# Patient Record
Sex: Male | Born: 1985 | Race: Black or African American | Hispanic: No | Marital: Married | Smoking: Never smoker
Health system: Southern US, Community
[De-identification: ages and names within clinical notes are randomized; demographics above are authoritative.]

## PROBLEM LIST (undated history)

## (undated) DIAGNOSIS — G35 Multiple sclerosis: Secondary | ICD-10-CM

## (undated) DIAGNOSIS — I10 Essential (primary) hypertension: Secondary | ICD-10-CM

## (undated) DIAGNOSIS — G5600 Carpal tunnel syndrome, unspecified upper limb: Secondary | ICD-10-CM

## (undated) DIAGNOSIS — G259 Extrapyramidal and movement disorder, unspecified: Secondary | ICD-10-CM

## (undated) DIAGNOSIS — H539 Unspecified visual disturbance: Secondary | ICD-10-CM

## (undated) HISTORY — PX: OTHER SURGICAL HISTORY: SHX169

## (undated) HISTORY — DX: Unspecified visual disturbance: H53.9

## (undated) HISTORY — DX: Extrapyramidal and movement disorder, unspecified: G25.9

## (undated) HISTORY — DX: Essential (primary) hypertension: I10

## (undated) HISTORY — PX: MOUTH SURGERY: SHX715

## (undated) HISTORY — DX: Carpal tunnel syndrome, unspecified upper limb: G56.00

## (undated) HISTORY — DX: Multiple sclerosis: G35

---

## 2007-03-13 ENCOUNTER — Emergency Department (HOSPITAL_COMMUNITY): Admission: EM | Admit: 2007-03-13 | Discharge: 2007-03-13 | Payer: Self-pay | Admitting: Emergency Medicine

## 2007-03-16 ENCOUNTER — Emergency Department (HOSPITAL_COMMUNITY): Admission: EM | Admit: 2007-03-16 | Discharge: 2007-03-16 | Payer: Self-pay | Admitting: Emergency Medicine

## 2013-10-05 ENCOUNTER — Other Ambulatory Visit: Payer: Self-pay | Admitting: Occupational Medicine

## 2013-10-05 ENCOUNTER — Ambulatory Visit
Admission: RE | Admit: 2013-10-05 | Discharge: 2013-10-05 | Disposition: A | Discharge status: Home / Self Care | Payer: No Typology Code available for payment source | Source: Ambulatory Visit | Attending: Occupational Medicine | Admitting: Occupational Medicine

## 2013-10-05 DIAGNOSIS — Z021 Encounter for pre-employment examination: Secondary | ICD-10-CM

## 2013-11-02 ENCOUNTER — Ambulatory Visit (INDEPENDENT_AMBULATORY_CARE_PROVIDER_SITE_OTHER): Payer: BC Managed Care – PPO | Admitting: Neurology

## 2013-11-02 ENCOUNTER — Encounter: Payer: Self-pay | Admitting: Neurology

## 2013-11-02 VITALS — BP 153/101 | HR 104 | Temp 98.1°F | Ht 69.0 in | Wt 243.0 lb

## 2013-11-02 DIAGNOSIS — R270 Ataxia, unspecified: Secondary | ICD-10-CM | POA: Insufficient documentation

## 2013-11-02 DIAGNOSIS — R42 Dizziness and giddiness: Secondary | ICD-10-CM

## 2013-11-02 DIAGNOSIS — R202 Paresthesia of skin: Secondary | ICD-10-CM

## 2013-11-02 DIAGNOSIS — G5602 Carpal tunnel syndrome, left upper limb: Secondary | ICD-10-CM

## 2013-11-02 DIAGNOSIS — G5600 Carpal tunnel syndrome, unspecified upper limb: Secondary | ICD-10-CM | POA: Insufficient documentation

## 2013-11-02 NOTE — Patient Instructions (Addendum)
Overall you are doing fairly well but I do want to suggest a few things today:   Remember to drink plenty of fluid, eat healthy meals and do not skip any meals. Try to eat protein with a every meal and eat a healthy snack such as fruit or nuts in between meals. Try to keep a regular sleep-wake schedule and try to exercise daily, particularly in the form of walking, 20-30 minutes a day, if you can.   As far as diagnostic testing: MRi of the brain, EMG/NCS left arm, Labs today  I would like to see you back in 3 months, sooner if we need to. Please call us with any interim questions, concerns, problems, updates or refill requests.   Please also call us for any test results so we can go over those with you on the phone.  My clinical assistant and will answer any of your questions and relay your messages to me and also relay most of my messages to you.   Our phone number is [phone]. We also have an after hours call service for urgent matters and there is a physician on-call for urgent questions. For any emergencies you know to call 911 or go to the nearest emergency room

## 2013-11-02 NOTE — Progress Notes (Signed)
[facility]    Provider:  Dr Lucia Gaskins Referring Provider: Delfin Gant, MD Primary Care Physician:  No primary care provider on file.  CC:  Dizziness and numbness  HPI:  Lucas Berry is a 28 y.o. male here as a referral from Dr. Penni Bombard for Dizziness and numbness. Dizziness started 4-5 months ago and occurs several times a day and lasts for several minutes until he sits. He describes feeling off balance, not progressing, no inciting factors, no head trauma but is having a lot of stress currently. He can't walk straight, veers to the right or left. This happens if he has been sitting for too long and stands up to walk or if he has been walking for too long.He denies feeling lightheaded, feeling like he is going to pass out, no room spinning, no vision changes, no hearing changes, no weakness, no speech slurring or any other focal neurologic symptom. No CP, SOB,palpitations. No history of neurologic symptoms. No significanr PMHx.   He has been diagnosed with left hand carpal tunnel syndrome, numbness in all the fingers, denies weakness, no neck pain. Wrist splints not working. No radiation. Denies any pain.  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 Denies CP, SOB, palpitations, cough. Pertinent negatives per HPI. All others negative.   History   Social History  . Marital Status: Single    Spouse Name: N/A    Number of Children: 2  . Years of Education: Assoc   Occupational History  .  Other    [employer]   Social History Main Topics  . Smoking status: Never Smoker   . Smokeless tobacco: Never Used  . Alcohol Use: 0.0 oz/week    0  Not specified per week     Comment: weekly  . Drug Use: No  . Sexual Activity: Not on file   Other Topics Concern  . Not on file   Social History Narrative   Patient lives at home with his family.   Caffeine use: occasionally    Family History  Problem Relation Age of Onset  . High blood pressure Mother   . Cancer Maternal Grandmother     History reviewed. No pertinent past medical history.  Past Surgical History  Procedure Laterality Date  . Mouth surgery  10 years ago    No current outpatient prescriptions on file.   No current facility-administered medications for this visit.    Allergies as of 11/02/2013  . (No Known Allergies)    Vitals: BP 153/101 mmHg  Pulse 104  Temp(Src) 98.1 F (36.7 C) (Oral)  Ht 5\' 9"  (1.753 m)  Wt 243 lb (110.224 kg)  BMI 35.87 kg/m2 Last Weight:  Wt Readings from Last 1 Encounters:  11/02/13 243 lb (110.224 kg)   Last Height:   Ht Readings from Last 1 Encounters:  11/02/13 5\' 9"  (1.753 m)    Physical exam: Exam: Gen: NAD, conversant, well nourised, obese, well groomed                     CV: RRR, no MRG. No Carotid Bruits. No peripheral edema, warm, nontender Eyes: Conjunctivae clear without exudates or hemorrhage  Neuro: Detailed Neurologic Exam  Speech:    Speech is normal; fluent and spontaneous with normal comprehension.  Cognition:    The patient is oriented to person, place, and time;     recent and remote memory intact;     language fluent;     normal attention, concentration,     fund of knowledge Cranial Nerves:    The pupils are equal, round, and reactive to light. The fundi are normal and spontaneous venous pulsations are present. Visual fields are full to finger confrontation. Extraocular movements are intact. Trigeminal sensation is intact and the muscles of mastication are normal. The face is symmetric. The palate elevates in the midline. Voice is normal. Shoulder shrug is normal. The tongue has normal  motion without fasciculations.   Coordination:    Normal finger to nose and heel to shin. Normal rapid alternating movements.   Gait:    Heel-toe and tandem gait are normal.   Motor Observation:    No asymmetry, no atrophy, and no involuntary movements noted. Tone:    Normal muscle tone.    Posture:    Posture is normal. normal erect    Strength:    Strength is V/V in the upper and lower limbs.      Sensation: intact to LT     Reflex Exam:  DTR's:    Deep tendon reflexes in the upper and lower extremities are normal bilaterally.   Toes:    The toes are downgoing bilaterally.   Clonus:    3 beats clonus bilateral ankle jerks      Assessment/Plan:  28 year old male here for evaluation of ataxia and left hand paresthesias. Neuro exam unremarkable. Will order an MRI of the brain w/wo contrast to evaluate for his ataxia and feeling off balance with inability to walk straight (veers to the right and/or left), emg/ncs of the left arm to eval for left CTS and paresthesisas as well as labs today. His BP is high today and he is tachycardic but he says this is white-coat syndrome and his BP at home is always normal. Still recommend follow up with primary care for elevated BP and pulse.   Naomie DeanAntonia Ahern, MD  [facility] [address] [place], [place] [address]  Phone [phone] Fax [phone]

## 2013-11-03 LAB — CBC
HCT: 46.2 % (ref 37.5–51.0)
Hemoglobin: 15.1 g/dL (ref 12.6–17.7)
MCH: 26.1 pg — AB (ref 26.6–33.0)
MCHC: 32.7 g/dL (ref 31.5–35.7)
MCV: 80 fL (ref 79–97)
PLATELETS: 267 10*3/uL (ref 150–379)
RBC: 5.79 x10E6/uL (ref 4.14–5.80)
RDW: 13.9 % (ref 12.3–15.4)
WBC: 5.2 10*3/uL (ref 3.4–10.8)

## 2013-11-03 LAB — COMPREHENSIVE METABOLIC PANEL
A/G RATIO: 2.1 (ref 1.1–2.5)
ALBUMIN: 4.8 g/dL (ref 3.5–5.5)
ALK PHOS: 68 IU/L (ref 39–117)
ALT: 35 IU/L (ref 0–44)
AST: 30 IU/L (ref 0–40)
BILIRUBIN TOTAL: 0.4 mg/dL (ref 0.0–1.2)
BUN / CREAT RATIO: 7 — AB (ref 8–19)
BUN: 8 mg/dL (ref 6–20)
CO2: 25 mmol/L (ref 18–29)
CREATININE: 1.11 mg/dL (ref 0.76–1.27)
Calcium: 9.4 mg/dL (ref 8.7–10.2)
Chloride: 100 mmol/L (ref 97–108)
GFR, EST AFRICAN AMERICAN: 105 mL/min/{1.73_m2} (ref >59)
GFR, EST NON AFRICAN AMERICAN: 91 mL/min/{1.73_m2} (ref >59)
GLOBULIN, TOTAL: 2.3 g/dL (ref 1.5–4.5)
Glucose: 91 mg/dL (ref 65–99)
Potassium: 4.4 mmol/L (ref 3.5–5.2)
SODIUM: 140 mmol/L (ref 134–144)
Total Protein: 7.1 g/dL (ref 6.0–8.5)

## 2013-11-04 ENCOUNTER — Telehealth: Payer: Self-pay

## 2013-11-04 NOTE — Telephone Encounter (Signed)
Spoke to patient. Gave lab results. Patient requested to cancel his NCV/EMG. He states he is having a NCV/EMG done on 10/12/13 @ [facility].

## 2013-11-04 NOTE — Telephone Encounter (Signed)
-----   Message from Anson Fret, MD sent at 11/03/2013  3:50 PM EST ----- Please let patient know the labs look fine. Thank you

## 2013-11-13 ENCOUNTER — Encounter: Payer: BC Managed Care – PPO | Admitting: Neurology

## 2013-12-01 ENCOUNTER — Telehealth: Payer: Self-pay | Admitting: Neurology

## 2013-12-01 NOTE — Telephone Encounter (Signed)
Pt needs a letter for work would [place] to go back [place] in [place] please call pt [phone]

## 2013-12-01 NOTE — Telephone Encounter (Signed)
Can you call patient and let him know that I ordered an MRi of the brain. Doesn't look like he has had it done yet. I would like that completed before writing him a letter. Thank you

## 2013-12-01 NOTE — Telephone Encounter (Signed)
Returned call. Lmom that he has the wrong office. Dr. Karel Jarvis hasn't seen him he has been seen at [facility].

## 2013-12-01 NOTE — Telephone Encounter (Signed)
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  Please call and advise.

## 2013-12-02 NOTE — Telephone Encounter (Signed)
Spoke to patient and he is scheduled for MRI on Wed Dec 9th. Patient will like results soon as they get back.

## 2013-12-09 ENCOUNTER — Ambulatory Visit (INDEPENDENT_AMBULATORY_CARE_PROVIDER_SITE_OTHER): Payer: BC Managed Care – PPO

## 2013-12-09 DIAGNOSIS — R42 Dizziness and giddiness: Secondary | ICD-10-CM

## 2013-12-09 DIAGNOSIS — R270 Ataxia, unspecified: Secondary | ICD-10-CM

## 2013-12-10 ENCOUNTER — Telehealth: Payer: Self-pay | Admitting: Neurology

## 2013-12-10 MED ORDER — GADOPENTETATE DIMEGLUMINE 469.01 MG/ML IV SOLN: 20.0000 mL | Freq: Once | INTRAVENOUS | Status: AC | PRN

## 2013-12-10 NOTE — Telephone Encounter (Signed)
Spoke to patient he is aware lab results are not back yet

## 2013-12-10 NOTE — Telephone Encounter (Signed)
Pt is calling requesting MRI results.  Please call as soon as possible with results.  He needs the results for his job.

## 2013-12-14 ENCOUNTER — Telehealth: Payer: Self-pay | Admitting: Neurology

## 2013-12-14 NOTE — Telephone Encounter (Signed)
Pt is calling back requesting MRI results.  Please call and advise.

## 2013-12-14 NOTE — Telephone Encounter (Signed)
Called and left a message to speak with him about his MRI of the brain. Would prefer if he came in and I could discuss with him as results are suspicious for chronic demyelinating plaques.  Judeth Cornfield can you please call and see if he can come in for an appointment to review results? I will need 30 minutes, not a 15 minute follow up. Let me know if you schedule him so I can get the CD of his images from medical records. Thanks.

## 2013-12-14 NOTE — Telephone Encounter (Signed)
Patient stated Employer needs MRI results today.  Please call and advise.

## 2013-12-14 NOTE — Telephone Encounter (Signed)
Pt is calling back wanting his MRI results faxed to [facility] Attn: Noralee Chars [phone]. Also needs documentation from the doctor stating yes or no if he can start the [employer].  Please call and advise.

## 2013-12-15 ENCOUNTER — Ambulatory Visit (INDEPENDENT_AMBULATORY_CARE_PROVIDER_SITE_OTHER): Payer: BC Managed Care – PPO | Admitting: Neurology

## 2013-12-15 ENCOUNTER — Encounter: Payer: Self-pay | Admitting: Neurology

## 2013-12-15 VITALS — BP 133/87 | HR 100 | Ht 69.0 in | Wt 242.8 lb

## 2013-12-15 DIAGNOSIS — R42 Dizziness and giddiness: Secondary | ICD-10-CM

## 2013-12-15 DIAGNOSIS — R270 Ataxia, unspecified: Secondary | ICD-10-CM

## 2013-12-15 DIAGNOSIS — G35 Multiple sclerosis: Secondary | ICD-10-CM

## 2013-12-15 DIAGNOSIS — R9082 White matter disease, unspecified: Secondary | ICD-10-CM

## 2013-12-15 DIAGNOSIS — R930 Abnormal findings on diagnostic imaging of skull and head, not elsewhere classified: Secondary | ICD-10-CM

## 2013-12-15 NOTE — Telephone Encounter (Signed)
Patient was seen today by Dr. Ahern.  

## 2013-12-15 NOTE — Progress Notes (Signed)
[facility]    Provider:  Dr Lucia Gaskins Referring Provider: No ref. provider found Primary Care Physician:  No primary care provider on file.  CC:  Dizziness and Numbness  HPI:  Lucas Berry is a 28 y.o. male here as a follow up for Dizziness and numbness and to review his MRI results. The dizziness has improved. The numbness in the hand has improved. 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 The feelings of off balance are better. Denies weakness. His ataxia has improved. He took some steroids and feels better.   Reviewed MRI of the brain with patient, showed him images and explained results. Given symptoms, age and MRI findings, is suspicious for demyelinating disease.  Reviewed notes, labs and imaging from outside physicians, which showed: MRi of the brain showed Multiple round and ovoid, periventricular and subcortical, pontine, middle cerebelllar peduncle, medullary and upper C2 cervical spinal cord T2 hyperintense lesions. Findings are suspicious for chronic demyelinating plaques. Other autoimmune, inflammatory or post-infectious etiologies can have a similar appearance. No enhancing lesions.   Review of Systems: Patient complains of symptoms per HPI as well as the following symptoms: No CP, No SOB. Pertinent negatives per HPI. All others negative.  Initial encounter 11/2013:    Lucas Berry is a 28 y.o. male here as a referral from Dr. Penni Bombard for Dizziness and numbness. Dizziness started 4-5 months ago and occurs several times a day and lasts for several minutes until he sits. He describes feeling off balance, not progressing, no inciting factors, no head trauma but is having a lot of stress currently. He can't walk straight, veers to the right or left. This happens if he has been sitting for too long and stands up to walk or if he has been walking for too long.He denies feeling lightheaded, feeling like he is going to pass out, no room spinning, no vision  changes, no hearing changes, no weakness, no speech slurring or any other focal neurologic symptom. No CP, SOB,palpitations. No history of neurologic symptoms. No significanr PMHx.   He has been diagnosed with left hand carpal tunnel syndrome, numbness in all the fingers, denies weakness, no neck pain. Wrist splints not working. No radiation. Denies any pain.  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 Marital Status: Single    Spouse Name: N/A    Number of Children: 2  . Years of Education: Assoc   Occupational History  .  Other    [employer]   Social History Main Topics  . Smoking status: Never Smoker   . Smokeless tobacco: Never Used  . Alcohol Use: 0.0 oz/week    0 Not specified per week     Comment: weekly  . Drug Use: No  . Sexual Activity: Not on file   Other Topics Concern  . Not on file   Social History Narrative   Patient lives at home with his family.   Caffeine use: occasionally   Patient has a college education    Patient has 2 children    Patient is right handed     Family History  Problem Relation Age of Onset  . High blood pressure Mother   . Cancer Maternal Grandmother     Past Medical  History  Diagnosis Date  . Carpal tunnel syndrome     Past Surgical History  Procedure Laterality Date  . Mouth surgery  10 years ago    No current outpatient prescriptions on file.   No current facility-administered medications for this visit.    Allergies as of 12/15/2013  . (No Known Allergies)    Vitals: BP 133/87 mmHg  Pulse 100  Ht 5\' 9"  (1.753 m)  Wt 242 lb 12.8 oz (110.133 kg)  BMI 35.84 kg/m2 Last Weight:  Wt Readings from Last 1 Encounters:  12/15/13 242 lb 12.8 oz (110.133 kg)   Last  Height:   Ht Readings from Last 1 Encounters:  12/15/13 5\' 9"  (1.753 m)   Physical exam: Exam: Gen: NAD, conversant, well nourised, obese, well groomed                     CV: RRR, no MRG. No Carotid Bruits. No peripheral edema, warm, nontender Eyes: Conjunctivae clear without exudates or hemorrhage  Neuro: Detailed Neurologic Exam  Speech:    Speech is normal; fluent and spontaneous with normal comprehension.  Cognition:    The patient is oriented to person, place, and time;     recent and remote memory intact;     language fluent;     normal attention, concentration,     fund of knowledge Cranial Nerves:    The pupils are equal, round, and reactive to light. The fundi are normal and spontaneous venous pulsations are present. Visual fields are full to finger confrontation. Extraocular movements are intact. Trigeminal sensation is intact and the muscles of mastication are normal. The face is symmetric. The palate elevates in the midline. Voice is normal. Shoulder shrug is normal. The tongue has normal motion without fasciculations.   Coordination:    Normal finger to nose and heel to shin. Normal rapid alternating movements.   Gait:    Heel-toe and tandem gait are normal.   Motor Observation:    No asymmetry, no atrophy, and no involuntary movements noted. Tone:    Normal muscle tone.    Posture:    Posture is normal. normal erect    Strength:    Strength is V/V in the upper and lower limbs.      Sensation:  intact to LT     Reflex Exam:  DTR's:    Deep tendon reflexes in the upper and lower extremities are normal bilaterally.   Toes:    The toes are downgoing bilaterally.   Clonus:    3 beats bilat.       Assessment/Plan:  28 year old male here for follow up of dizziness and ataxia. Reviewed MRI of the brain with patient, showed him images and explained results. Given symptoms, age and MRI findings, is suspicious for demyelinating disease.   MRi of the  brain showed Multiple round and ovoid, periventricular and subcortical, pontine, middle cerebelllar peduncle, medullary and upper C2 cervical spinal cord T2 hyperintense lesions. Findings are suspicious for chronic demyelinating plaques. Other autoimmune, inflammatory or post-infectious etiologies can have a similar appearance. No enhancing lesions.    Naomie DeanAntonia Ahern, MD  [facility] [address] [place], [place] [address]  Phone [phone] Fax [phone]

## 2013-12-15 NOTE — Telephone Encounter (Signed)
Patient was seen today by Dr. Lucia Gaskins.

## 2013-12-16 ENCOUNTER — Telehealth: Payer: Self-pay | Admitting: Neurology

## 2013-12-16 NOTE — Telephone Encounter (Signed)
Don't see where patient is suppose to follow up this week. I see patient is suppose to follow up in 3 months. Please advise

## 2013-12-16 NOTE — Telephone Encounter (Signed)
Patient questioning next appointment with Dr. Lucia GaskinsAhern.  According to patient, he stated MD wanted to fu with another day this week.  Please call and advise.

## 2013-12-17 DIAGNOSIS — G35 Multiple sclerosis: Secondary | ICD-10-CM | POA: Insufficient documentation

## 2013-12-17 NOTE — Addendum Note (Signed)
Addended by: Naomie DeanAHERN, Hildreth Orsak B on: 12/17/2013 08:04 PM   Modules accepted: Orders

## 2013-12-17 NOTE — Addendum Note (Signed)
Addended by: Naomie Dean B on: 12/17/2013 08:15 PM   Modules accepted: Orders

## 2013-12-17 NOTE — Telephone Encounter (Signed)
Lucas Berry, would you call patient and let him know the following:  1. He can come to the office anytime and have his labs drawn. The orders are placed. Just tell him the lab hours so he doesn't come when they are at lunch or closed 2. I have placed a referral to [facility] for lumbar puncture and they should call him next week for the appointment 3. I have placed an order for visual evoked potentials, he can set up an appointment to have than done here 4. I am going to order imaging of his cervical cord and thoracic cord, both MRIs. We will get that approved and call him for appointment.   Thank you.

## 2013-12-21 NOTE — Telephone Encounter (Signed)
Patient calling again regarding next appointment.  I relayed information per Dr. Trevor Mace instructions.  I didn't see an order for visual evoked potentials test.  Please call and advise.

## 2013-12-22 ENCOUNTER — Ambulatory Visit (INDEPENDENT_AMBULATORY_CARE_PROVIDER_SITE_OTHER): Payer: BC Managed Care – PPO | Admitting: Diagnostic Neuroimaging

## 2013-12-22 ENCOUNTER — Other Ambulatory Visit (INDEPENDENT_AMBULATORY_CARE_PROVIDER_SITE_OTHER): Payer: Self-pay

## 2013-12-22 DIAGNOSIS — G35 Multiple sclerosis: Secondary | ICD-10-CM

## 2013-12-22 DIAGNOSIS — Z0289 Encounter for other administrative examinations: Secondary | ICD-10-CM

## 2013-12-22 NOTE — Progress Notes (Signed)
See report

## 2013-12-22 NOTE — Procedures (Addendum)
    [facility]  VEP (VISUAL EVOKED POTENTIAL) REPORT   STUDY DATE: 12/22/13  PATIENT NAME: Desiree LucyKevin Fatima DOB: 11/30/1985 MRN: [id number]  ORDERING CLINICIAN: Naomie DeanAntonia Ahern, MD   TECHNOLOGIST: Gearldine ShownLorraine Jones  TECHNIQUE: The visual evoked potential test was performed using 32 x 32 check sizes with full pattern reversal. CLINICAL INFORMATION: 28 year old male with abnormal MRI brain. Evaluate for demyelinating disease.  FINDINGS: The visual acuity was 20/30 OD and 20/30 OS.  There are well formed evoked potential wave forms bilaterally.   P100 latency with right eye stimulation: 128 ms.   P100 latency with left eye stimulation: 130 ms.  The amplitudes for the P100 waveforms were also within normal limits bilaterally.   IMPRESSION:  Abnormal visual evoked potential study demonstrating: - The bilateral P100 latencies with right and left eye stimulation are symmetrically prolonged. 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     INTERPRETING PHYSICIAN:  Suanne MarkerVIKRAM R. Silas Muff, MD Certified in Neurology, Neurophysiology and Neuroimaging  [facility] [address], [address] [place], [place] [id number] [phone]

## 2013-12-23 ENCOUNTER — Telehealth: Payer: Self-pay | Admitting: Neurology

## 2013-12-23 NOTE — Telephone Encounter (Signed)
Do not see results, please advise.

## 2013-12-23 NOTE — Telephone Encounter (Signed)
Would you call patient back and let him know it takes up to 5 days for all the lab results to be completed. I will call him when all the results are back.

## 2013-12-23 NOTE — Telephone Encounter (Signed)
Patient requesting blood work results.  Please call and advise. °

## 2013-12-23 NOTE — Telephone Encounter (Signed)
Spoke to patient and he is aware and verbalizes understanding.  

## 2013-12-24 LAB — PAN-ANCA
ANCA Proteinase 3: <3.5 U/mL (ref 0.0–3.5)
C-ANCA: <1:20 {titer}
Myeloperoxidase Ab: <9 U/mL (ref 0.0–9.0)

## 2013-12-24 LAB — COMPREHENSIVE METABOLIC PANEL
A/G RATIO: 2.3 (ref 1.1–2.5)
ALK PHOS: 61 IU/L (ref 39–117)
ALT: 33 IU/L (ref 0–44)
AST: 29 IU/L (ref 0–40)
Albumin: 4.5 g/dL (ref 3.5–5.5)
BUN / CREAT RATIO: 8 (ref 8–19)
BUN: 9 mg/dL (ref 6–20)
CO2: 23 mmol/L (ref 18–29)
CREATININE: 1.12 mg/dL (ref 0.76–1.27)
Calcium: 9.3 mg/dL (ref 8.7–10.2)
Chloride: 103 mmol/L (ref 97–108)
GFR calc Af Amer: 104 mL/min/{1.73_m2} (ref >59)
GFR, EST NON AFRICAN AMERICAN: 90 mL/min/{1.73_m2} (ref >59)
Globulin, Total: 2 g/dL (ref 1.5–4.5)
Glucose: 104 mg/dL — ABNORMAL HIGH (ref 65–99)
Potassium: 4.1 mmol/L (ref 3.5–5.2)
SODIUM: 139 mmol/L (ref 134–144)
Total Bilirubin: 0.6 mg/dL (ref 0.0–1.2)
Total Protein: 6.5 g/dL (ref 6.0–8.5)

## 2013-12-24 LAB — B12 AND FOLATE PANEL
Folate: 16.1 ng/mL (ref >3.0)
VITAMIN B 12: 469 pg/mL (ref 211–946)

## 2013-12-24 LAB — IFE AND PE, SERUM
ALBUMIN SERPL ELPH-MCNC: 4 g/dL (ref 3.2–5.6)
ALBUMIN/GLOB SERPL: 1.7 (ref 0.7–2.0)
Alpha 1: 0.2 g/dL (ref 0.1–0.4)
Alpha2 Glob SerPl Elph-Mcnc: 0.4 g/dL (ref 0.4–1.2)
B-Globulin SerPl Elph-Mcnc: 1 g/dL (ref 0.6–1.3)
GAMMA GLOB SERPL ELPH-MCNC: 0.9 g/dL (ref 0.5–1.6)
Globulin, Total: 2.5 g/dL (ref 2.0–4.5)
IGG (IMMUNOGLOBIN G), SERUM: 1052 mg/dL (ref 700–1600)
IgA/Immunoglobulin A, Serum: 165 mg/dL (ref 91–414)
IgM (Immunoglobulin M), Srm: 85 mg/dL (ref 40–230)

## 2013-12-24 LAB — CBC
HEMATOCRIT: 44.6 % (ref 37.5–51.0)
HEMOGLOBIN: 14.8 g/dL (ref 12.6–17.7)
MCH: 26 pg — AB (ref 26.6–33.0)
MCHC: 33.2 g/dL (ref 31.5–35.7)
MCV: 78 fL — ABNORMAL LOW (ref 79–97)
Platelets: 279 10*3/uL (ref 150–379)
RBC: 5.7 x10E6/uL (ref 4.14–5.80)
RDW: 13.8 % (ref 12.3–15.4)
WBC: 5 10*3/uL (ref 3.4–10.8)

## 2013-12-24 LAB — ANA W/REFLEX: Anti Nuclear Antibody(ANA): NEGATIVE

## 2013-12-24 LAB — HIV ANTIBODY (ROUTINE TESTING W REFLEX): HIV-1/HIV-2 Ab: NONREACTIVE

## 2013-12-24 LAB — ANTI-SCLERODERMA ANTIBODY: Scleroderma SCL-70: <0.2 AI (ref 0.0–0.9)

## 2013-12-24 LAB — SJOGREN'S SYNDROME ANTIBODS(SSA + SSB): ENA SSB (LA) Ab: <0.2 AI (ref 0.0–0.9)

## 2013-12-24 LAB — ANTI-DNA ANTIBODY, DOUBLE-STRANDED: dsDNA Ab: <1 IU/mL (ref 0–9)

## 2013-12-24 LAB — TSH: TSH: 2.3 u[IU]/mL (ref 0.450–4.500)

## 2013-12-24 LAB — RPR: SYPHILIS RPR SCR: NONREACTIVE

## 2013-12-24 LAB — SEDIMENTATION RATE: SED RATE: 6 mm/h (ref 0–15)

## 2013-12-24 LAB — NMO IGG AUTOANTIBODIES: NMO-IgG: <1.5 U/mL (ref 0.0–3.0)

## 2013-12-24 LAB — C-REACTIVE PROTEIN: CRP: 0.8 mg/L (ref 0.0–4.9)

## 2013-12-24 LAB — ANGIOTENSIN CONVERTING ENZYME: Angio Convert Enzyme: 22 U/L (ref 14–82)

## 2013-12-24 LAB — HTLV-I/II ANTIBODIES, QUAL.: HTLV I/II AB: NEGATIVE

## 2013-12-24 LAB — RHEUMATOID FACTOR: Rhuematoid fact SerPl-aCnc: 10.8 IU/mL (ref 0.0–13.9)

## 2013-12-28 ENCOUNTER — Telehealth: Payer: Self-pay | Admitting: Neurology

## 2013-12-28 NOTE — Telephone Encounter (Signed)
Patient called back and stated his work schedule has changed and wanted to know if he could have an appointment for later today.  Please call and advise.

## 2013-12-28 NOTE — Telephone Encounter (Signed)
I called the patient and he is not going to be able to keep appointment on Wednesday due to work conflict.  The next available is not until 01/08/14 but the pt is very anxious to get his results.  He said that he can be reached anytime and that he will have his phone on so that he can accept your call.  Pt is asking for Dr. Lucia GaskinsAhern to try and call him again to give results.

## 2013-12-28 NOTE — Telephone Encounter (Signed)
Patient called and I was able to make the patient an appointment for this Friday to go over his lab results.

## 2013-12-28 NOTE — Addendum Note (Signed)
Addended by: Arlis PortaHUGHES, Reizy Dunlow on: 12/28/2013 11:24 AM   Modules accepted: Medications

## 2013-12-28 NOTE — Telephone Encounter (Signed)
Tried calling patient. Home number went to voice mail and it was full so could not leave a message. His work number is a [employer] number.   Lucas NeedleMichael - can you please send a letter to patient informing him that we could not reach him by phone, we tried to call regarding his labwork and his visual evoked potential results. He should follow up with me in the office to review them all. Thank you.

## 2013-12-29 ENCOUNTER — Telehealth: Payer: Self-pay | Admitting: Neurology

## 2013-12-29 NOTE — Telephone Encounter (Signed)
Here's another 

## 2013-12-29 NOTE — Telephone Encounter (Signed)
Called patient again tonight. [phone]. Was able to leave a message tonight. Said that I tried calling multiple times the last few days  and his voicemail was full. I will try calling back again tomorrow.

## 2013-12-29 NOTE — Telephone Encounter (Signed)
Patient questioning if it's ok to start [employer] next 01/04/14 according to blood work results.  Please call and advise.

## 2013-12-30 ENCOUNTER — Other Ambulatory Visit: Payer: Self-pay | Admitting: Neurology

## 2013-12-30 ENCOUNTER — Ambulatory Visit: Payer: BC Managed Care – PPO | Admitting: Neurology

## 2013-12-30 DIAGNOSIS — G35 Multiple sclerosis: Secondary | ICD-10-CM

## 2013-12-30 NOTE — Telephone Encounter (Signed)
Reviewed labs with patient and abnormal results on visual evoked potentials. Really need his lumbar puncture results. He states he has not had a call to schedule lumbar puncture.  Lucas Berry - would you find out where his referral was sent and get the lumbar puncture scheduled please?  After his lumbar puncture results, I would like for him to see Dr. Epimenio Foot for a new MS diagnosis. So can you put him on Dr. Bonnita Hollow schedule as well please Lucas Berry? Touch base with the patient about all of this. Thank you

## 2013-12-30 NOTE — Telephone Encounter (Signed)
Called [facility], Dr Lucia Gaskins put in additional order, they will schedule.

## 2013-12-30 NOTE — Telephone Encounter (Signed)
Lucas Berry, will you see that they see Dr Epimenio Foot after the Lumbar Puncture results are available.

## 2013-12-30 NOTE — Telephone Encounter (Signed)
See additional phone note. 

## 2014-01-08 ENCOUNTER — Ambulatory Visit: Payer: BC Managed Care – PPO | Admitting: Neurology

## 2014-01-12 ENCOUNTER — Telehealth: Payer: Self-pay | Admitting: Neurology

## 2014-01-12 NOTE — Telephone Encounter (Signed)
Called patient at home (work/mobile is not current). Left message inquiring whether he had the lumbar puncture yet or an appointment. If he has any questions or has any problems he should let us know. We really need the lumbar puncture results, MRI of the brain and visual evoked potentials were both abnormal and suspicious for MS and earlier treatment improves outcomes.

## 2014-02-02 NOTE — Telephone Encounter (Signed)
Message was left for patient on Friday 01/29/14 that he could get in next week. Waiting on patient to call her back. Victorino Dike will call him back today.

## 2014-02-02 NOTE — Telephone Encounter (Signed)
Has patient had LP scheduled yet? If so he needs a follow-up with Dr. Epimenio Foot

## 2014-02-03 NOTE — Telephone Encounter (Signed)
Called patient to check  Had  To leave a voice mail. to see if he had his lumbar Puncture because patient needs to follow up with Dr.Sather after LP.

## 2014-02-03 NOTE — Telephone Encounter (Signed)
Patient called stating he has not had his LP scheduled yet. He been playing phone tag with them and will be calling them again today to get scheduled. Patient states that he will call the office as soon as he gets scheduled with them so that he can make an appt here for [place].

## 2014-03-05 ENCOUNTER — Telehealth: Payer: Self-pay | Admitting: *Deleted

## 2014-03-05 ENCOUNTER — Ambulatory Visit
Admission: RE | Admit: 2014-03-05 | Discharge: 2014-03-05 | Disposition: A | Discharge status: Home / Self Care | Payer: BLUE CROSS/BLUE SHIELD | Source: Ambulatory Visit | Attending: Neurology | Admitting: Neurology

## 2014-03-05 ENCOUNTER — Other Ambulatory Visit: Payer: Self-pay | Admitting: Neurology

## 2014-03-05 DIAGNOSIS — G35 Multiple sclerosis: Secondary | ICD-10-CM

## 2014-03-05 LAB — CSF CELL COUNT WITH DIFFERENTIAL
RBC Count, CSF: 0 cu mm
Tube #: 3
WBC, CSF: 4 cu mm (ref 0–5)

## 2014-03-05 LAB — GLUCOSE, CSF: Glucose, CSF: 63 mg/dL (ref 43–76)

## 2014-03-05 LAB — PROTEIN, CSF: TOTAL PROTEIN, CSF: 70 mg/dL — AB (ref 15–45)

## 2014-03-05 NOTE — Telephone Encounter (Signed)
Talked with Duwayne Heck from [facility] and she needs to speak with Dr. Lucia Gaskins to clarify orders for LP. Dr. Lucia Gaskins calling Danielle back.

## 2014-03-05 NOTE — Progress Notes (Signed)
Discharge instructions explained to pt. Blood drawn to go with spinal fluid and other serum for other labs as well.  Blood drawn from left [facility], site is unremarkable and pt tolerated procedure well.  JKL RN

## 2014-03-05 NOTE — Discharge Instructions (Signed)

## 2014-03-07 LAB — JC POLYOMA VIRUS DNA, QUAL R-T PCR: JC POLYOMA VIRUS DNA, QL: NOT DETECTED

## 2014-03-08 LAB — CSF CULTURE W GRAM STAIN: Organism ID, Bacteria: NO GROWTH

## 2014-03-08 LAB — CSF CULTURE: Gram Stain: NONE SEEN

## 2014-03-09 ENCOUNTER — Telehealth: Payer: Self-pay | Admitting: *Deleted

## 2014-03-09 ENCOUNTER — Telehealth: Payer: Self-pay

## 2014-03-09 NOTE — Telephone Encounter (Signed)
Mr. Lucas Berry called to report having headaches since his lumbar puncture on Friday, March 05, 2014.  I briefly explained an epidural blood patch procedure.  I encouraged him to resume the strict bedrest restrictions we had him on after the LP and to consume extra fluids, especially caffeinated ones and to contact Dr. Lucia Gaskins with his symptoms to see if she wants to have patient have the blood patch.  Donell Sievert, RN

## 2014-03-09 NOTE — Telephone Encounter (Signed)
Patient is still having headaches form LP which was done on Friday. Patient been doing bedrest for a couple of days now. He has had this headache since Friday. Please call the patient at [phone]

## 2014-03-09 NOTE — Telephone Encounter (Signed)
Spoke to him, his headache got better then he started having new nausea. sounds like maybe he has a GI bug, not related to the LP. He will stay at home and rest, drink fluids and keep [place] updated. If his headache persists he will def call back.

## 2014-03-10 ENCOUNTER — Telehealth: Payer: Self-pay | Admitting: *Deleted

## 2014-03-10 ENCOUNTER — Other Ambulatory Visit: Payer: Self-pay | Admitting: Neurology

## 2014-03-10 DIAGNOSIS — R51 Headache with orthostatic component, not elsewhere classified: Secondary | ICD-10-CM

## 2014-03-10 NOTE — Telephone Encounter (Signed)
Patient called and stated he's still experiencing headaches this am.  Please call and advise.

## 2014-03-10 NOTE — Telephone Encounter (Signed)
Talked with the pt and let him know Dr. Lucia Gaskins would like him to have a blood patch done at [facility]. I talked with Dr. Epimenio Foot and he said he will put the order in for Dr. Lucia Gaskins and I will call over the [place] [facility] to get him scheduled. I told patient I would give him a call back to let him know. Pt verbalized understanding.

## 2014-03-10 NOTE — Telephone Encounter (Signed)
Can you call this patient? See if the headache sounds like post-LP (positional). If so we will send him for blood patch. If not, lets see if we can squeeze him in tomorrow. thanks

## 2014-03-10 NOTE — Telephone Encounter (Signed)
Pt stated he had a LP on 03/05/14 and on Sunday night (03/07/14) and he was having a throbbing headache. He stated "I was on bed rest for 24 hr beforehand and I noticed when I was out and about Sunday that I had a headache". Patient states headache is better when laying down and it gets worse when standing. I talked with Deb from [facility] and she is going to call the patient to let him know he can come in at 930 tomorrow morning to have the blood patch done after the order is put in.

## 2014-03-11 ENCOUNTER — Telehealth: Payer: Self-pay | Admitting: Neurology

## 2014-03-11 ENCOUNTER — Ambulatory Visit
Admission: RE | Admit: 2014-03-11 | Discharge: 2014-03-11 | Disposition: A | Discharge status: Home / Self Care | Payer: BLUE CROSS/BLUE SHIELD | Source: Ambulatory Visit | Attending: Neurology | Admitting: Neurology

## 2014-03-11 DIAGNOSIS — R51 Headache with orthostatic component, not elsewhere classified: Secondary | ICD-10-CM

## 2014-03-11 LAB — OLIGOCLONAL BANDS, CSF + SERM

## 2014-03-11 LAB — CNS IGG SYNTHESIS RATE, CSF+BLOOD
Albumin, CSF: 37.1 mg/dL (ref 8.0–42.0)
Albumin, Serum(Neph): 4.3 g/dL (ref 3.7–5.1)
IgG Index, CSF: 0.78 — ABNORMAL HIGH (ref <0.66)
IgG, CSF: 7.2 mg/dL (ref 0.8–7.7)
IgG, Serum: 1070 mg/dL (ref 694–1618)
MS CNS IgG Synthesis Rate: 11.7 mg/24 h — ABNORMAL HIGH (ref <=3.3)

## 2014-03-11 LAB — MYELIN BASIC PROTEIN, CSF: Myelin Basic Protein: <2 mcg/L (ref 0.0–4.0)

## 2014-03-11 MED ORDER — IOHEXOL 180 MG/ML  SOLN
1.0000 mL | Freq: Once | INTRAMUSCULAR | Status: AC | PRN
  Administered 2014-03-11: 1 mL via EPIDURAL

## 2014-03-11 NOTE — Progress Notes (Signed)
Blood drawn from right [facility] for blood patch, 20 cc's obtained. Site is unremarkable and pt tolerated procedure well. Discharge instructions explained to pt.

## 2014-03-11 NOTE — Telephone Encounter (Signed)
Called to see how patient is feeling, if the blood patch helped with his post-LP headache. Also asked him to call back for lab test results and f/u with Dr. Epimenio Foot in the office. thanks

## 2014-03-11 NOTE — Telephone Encounter (Signed)
Patient is returning Dr Trevor Mace call of today 3/10.  He states he was asleep.  Please call back.  Thanks!

## 2014-03-11 NOTE — Discharge Instructions (Signed)

## 2014-03-12 ENCOUNTER — Telehealth: Payer: Self-pay | Admitting: Neurology

## 2014-03-12 ENCOUNTER — Telehealth: Payer: Self-pay | Admitting: *Deleted

## 2014-03-12 LAB — B. BURGDORFI ANTIBODIES, CSF: Lyme Ab: NEGATIVE

## 2014-03-12 NOTE — Telephone Encounter (Signed)
Called patient. Answering machine answered. Left message again. Advised him to call and make a new patient appointment with Dr. Epimenio Foot. Could not leave information regarding test results or interpretation given no identifying information on his voice mail. Thank you

## 2014-03-12 NOTE — Telephone Encounter (Signed)
Patient returning call. Patient is wanting a letter for work states he missed a couple of days this week. Patient is already scheduled with Dr. Epimenio Foot next week. Advised patient to try and answer the phone.

## 2014-03-12 NOTE — Telephone Encounter (Signed)
I can write the letter over the weekend. Lucas Berry, can you find out which days he missed please? Thanks.

## 2014-03-12 NOTE — Telephone Encounter (Signed)
Talked with patient and told him Dr. Lucia Gaskins can write a letter for Monday-Friday and Tuesday for his follow up appointment with Dr. Epimenio Foot. Pt verbalized understanding.  I told him he can have his GF pick it up (she is on his DPR) or get it on Tuesday at his appointment.

## 2014-03-15 ENCOUNTER — Encounter: Payer: Self-pay | Admitting: Neurology

## 2014-03-15 ENCOUNTER — Telehealth: Payer: Self-pay | Admitting: Neurology

## 2014-03-15 NOTE — Telephone Encounter (Signed)
Spoke to patient. Discussed LP results. He has an appointment tomorrow.

## 2014-03-15 NOTE — Telephone Encounter (Signed)
Patient calling for Lumbar Puncture results.  Please call and advise.

## 2014-03-16 ENCOUNTER — Encounter: Payer: Self-pay | Admitting: Neurology

## 2014-03-16 ENCOUNTER — Ambulatory Visit (INDEPENDENT_AMBULATORY_CARE_PROVIDER_SITE_OTHER): Payer: BLUE CROSS/BLUE SHIELD | Admitting: Neurology

## 2014-03-16 VITALS — BP 150/74 | HR 86 | Resp 16 | Ht 69.0 in | Wt 230.0 lb

## 2014-03-16 DIAGNOSIS — R350 Frequency of micturition: Secondary | ICD-10-CM | POA: Diagnosis not present

## 2014-03-16 DIAGNOSIS — G35 Multiple sclerosis: Secondary | ICD-10-CM

## 2014-03-16 DIAGNOSIS — R270 Ataxia, unspecified: Secondary | ICD-10-CM

## 2014-03-16 DIAGNOSIS — R200 Anesthesia of skin: Secondary | ICD-10-CM | POA: Diagnosis not present

## 2014-03-16 NOTE — Progress Notes (Signed)
[facility]  PATIENT: Lucas Berry DOB: 12-03-1985  REFERRING DOCTOR OR PCP:  Deatra James SOURCE: Patient  _________________________________   HISTORICAL  CHIEF COMPLAINT:  Chief Complaint  Patient presents with  . Multiple Sclerosis    Dx. with MS yesterday--presenting sx. gait/balance difficulty.  Dx. confirmed with mri and lp.  Lucas Berry needs to discuss MS therapy/fim  . Headaches    Lucas Berry c/o constant h/a since lumbar puncture 2 weeks ago.  Lucas Berry had a blood patch at [facility] last week, sts. still having h/a./fim    HISTORY OF PRESENT ILLNESS:  Lucas Berry is a 29 yo man who was just diagnosed with multiple sclerosis.   Lucas Berry first saw Dr. Lucia Gaskins in early December 2015 with a several month history of dizziness, ataxic gait and hand numbness.    MRI in early December 2015 that I personally reviewed showed multiple white matter foci including several in the posterior fossa (left middle cerebellar peduncle, pons, medulla) and periventricular foci in both hemispheres. Also had a spine focus at C2.  There were no acute lesions (no enhancement, no DWI brightness).   In late December blood work showed normal or negative vasculitis labs, ACE, Lyme, HIV, RPR, pan ANCA, and anti-NMO antibody.   Visual evoked potentials were abnormal showing prolongation of the P100 to about 130 ms on either side.  Since December, Lucas Berry has noticed some continuing issues with balance and mild weakness but is doing bette than Lucas Berry was.    It was Lucas Berry had a lumbar puncture on 03/05/2014. Abnormal showing > 5 oligoclonal bands and elevated IgG index of 0.78.     Lucas Berry needed a blood patch after the LP.  HA's are present still but better when Lucas Berry is upright.     Currently, gait is doing ok but not quite baseline.    Lucas Berry is able to run and climb stairs/ladders.   Lucas Berry is not stumbling.   Lucas Berry denies leg numbness but has had left hand numbness over a year.   Lucas Berry has some urinary frequency  But not bad enough to  consider treatment.    Lucas Berry denies any vision issues the last few months but notes Lucas Berry had trouble getting contacts with good correction last year.      Lucas Berry sees [facility]   Lucas Berry notes some fatigue with less [physical endurance but still does well.    Lucas Berry denies any problems with cognition.    Lucas Berry denies any issue with depression or anxiety.    Lucas Berry  Usually sleeps well but has done worse since HA started after LP.   REVIEW OF SYSTEMS: Constitutional: No fevers, chills, sweats, or change in appetite Eyes: No visual changes, double vision, eye pain Ear, nose and throat: No hearing loss, ear pain, nasal congestion, sore throat Cardiovascular: No chest pain, palpitations Respiratory: No shortness of breath at rest or with exertion.   No wheezes GastrointestinaI: No nausea, vomiting, diarrhea, abdominal pain, fecal incontinence Genitourinary: Mild frequency.  No incontinence.. Musculoskeletal: No neck pain, back pain Integumentary: No rash, pruritus, skin lesions Neurological: as above Psychiatric: No depression at this time.  No anxiety Endocrine: No palpitations, diaphoresis, change in appetite, change in weigh or increased thirst Hematologic/Lymphatic: No anemia, purpura, petechiae. Allergic/Immunologic: No itchy/runny eyes, nasal congestion, recent allergic reactions, rashes  ALLERGIES: No Known Allergies  HOME MEDICATIONS:  Current outpatient prescriptions:  .  amLODipine (NORVASC) 5 MG tablet, Take 5 mg by mouth daily., Disp: , Rfl: 0  PAST MEDICAL  HISTORY: Past Medical History  Diagnosis Date  . Carpal tunnel syndrome   . Multiple sclerosis   . Movement disorder   . Hypertension   . Vision abnormalities     PAST SURGICAL HISTORY: Past Surgical History  Procedure Laterality Date  . Mouth surgery  10 years ago    FAMILY HISTORY: Family History  Problem Relation Age of Onset  . High blood pressure Mother   . Cancer Maternal Grandmother   . High blood pressure Father      SOCIAL HISTORY:  History   Social History  . Marital Status: Single    Spouse Name: N/A  . Number of Children: 2  . Years of Education: Assoc   Occupational History  .  Other    [employer]   Social History Main Topics  . Smoking status: Never Smoker   . Smokeless tobacco: Never Used  . Alcohol Use: 0.0 oz/week    0 Standard drinks or equivalent per week     Comment: weekly  . Drug Use: No  . Sexual Activity: Not on file   Other Topics Concern  . Not on file   Social History Narrative   Patient lives at home with his family.   Caffeine use: occasionally   Patient has a college education    Patient has 2 children    Patient is right handed      PHYSICAL EXAM  Filed Vitals:   03/16/14 0952  BP: 150/74  Pulse: 86  Resp: 16  Height:  (1.753 m)  Weight: 230 lb (104.327 kg)    Body mass index is 33.95 kg/(m^2).   General: The patient is well-developed and well-nourished and in no acute distress  Eyes:  Funduscopic exam shows normal optic discs and retinal vessels.  Neck: The neck is supple, no carotid bruits are noted.  The neck is nontender.  Cardiovascular: The heart has a regular rate and rhythm with a normal S1 and S2. There were no murmurs, gallops or rubs. Lungs are clear to auscultation.  Skin: Extremities are without significant edema.  Musculoskeletal:  Back is nontender  Neurologic Exam  Mental status: The patient is alert and oriented x 3 at the time of the examination. The patient has apparent normal recent and remote memory, with an apparently normal attention span and concentration ability.   Speech is normal.  Cranial nerves: Extraocular movements are full. Pupils are equal, round, and reactive to light and accomodation.  Visual fields are full.  Facial symmetry is present. There is good facial sensation to soft touch bilaterally.Facial strength is normal.  Trapezius and sternocleidomastoid strength is normal. No dysarthria is  noted.  The tongue is midline, and the patient has symmetric elevation of the soft palate. No obvious hearing deficits are noted.  Motor:  Muscle bulk is normal.   Tone is normal. Strength is  5 / 5 in all 4 extremities.   Sensory: Sensory testing is intact to pinprick, soft touch and vibration sensation in all 4 extremities.  Coordination: Cerebellar testing reveals good finger-nose-finger and slightly off heel-to-shin bilaterally.  Gait and station: Station is normal.   Gait is normal but Tandem gait is wide. Romberg is negative.   Reflexes: Deep tendon reflexes are symmetric and normal bilaterally (2 in arms 3 in legs). No clonus today.   Plantar responses are flexor.    DIAGNOSTIC DATA (LABS, IMAGING, TESTING) - I reviewed patient records, labs, notes, testing and imaging myself where available.  Lab  Results  Component Value Date   WBC 5.0 12/22/2013   HGB 14.8 12/22/2013   HCT 44.6 12/22/2013   MCV 78* 12/22/2013   PLT 279 12/22/2013      Component Value Date/Time   NA 139 12/22/2013 0815   K 4.1 12/22/2013 0815   CL 103 12/22/2013 0815   CO2 23 12/22/2013 0815   GLUCOSE 104* 12/22/2013 0815   BUN 9 12/22/2013 0815   CREATININE 1.12 12/22/2013 0815   CALCIUM 9.3 12/22/2013 0815   PROT 6.5 12/22/2013 0815   AST 29 12/22/2013 0815   ALT 33 12/22/2013 0815   ALKPHOS 61 12/22/2013 0815   BILITOT 0.6 12/22/2013 0815   GFRNONAA 90 12/22/2013 0815   GFRAA 104 12/22/2013 0815   Lab Results  Component Value Date   VITAMINB12 469 12/22/2013   Lab Results  Component Value Date   TSH 2.300 12/22/2013       ASSESSMENT AND PLAN  Multiple sclerosis  Ataxia  Numbness  Urinary frequency   In summary, Rendon Howell is a 29 year old and who was just diagnosed with relapsing remitting multiple sclerosis. Lucas Berry has a fairly high plaque burden in the posterior fossa with lesions in the brainstem and also appears to have an upper cervical focus. We discussed the various  medications. As Lucas Berry has a more aggressive MS and Lucas Berry would prefer not to use an injectable therapy.   Gilenya would be a very reasonable initial therapy for him to start. I discussed the pros and cons of Gilenya including the need for the first day observation. Lucas Berry understands and wishes to proceed. Check an EKG today. It was normal. Lucas Berry has recent blood work and Lucas Berry had an eye exam for 5 months ago. Lucas Berry understands that Lucas Berry will need another ophthalmology exam 3 months after starting the medicine.  Lucas Berry signed the service request on and we will get it in later today and set up the first day observation shortly after getting authorization.   His positional headache is improved but not completely resolved. I discussed that this should continue to improve since Lucas Berry has had the blood patch.  Lucas Berry will return to see me for the first day observation but call sooner if Lucas Berry has new or worsening neurologic symptoms.    Hiliana Eilts A. Epimenio Foot, MD, PhD 03/16/2014, 10:32 AM Certified in Neurology, Clinical Neurophysiology, Sleep Medicine, Pain Medicine and Neuroimaging  [facility] [address] [place], [place] [address] [phone]

## 2014-03-17 ENCOUNTER — Telehealth: Payer: Self-pay | Admitting: Neurology

## 2014-03-17 NOTE — Telephone Encounter (Signed)
Spoke with Lucas Berry.  Dr. Daisy Blossom gave him a work note for the last 2 weeks.  He would like a note to be out of work until Lucas Berry.  He works in a call center, so sits for 8 hr. shifts.  Also still having h/a's that interfere with work.  Lucas Berry will speak with RAS and let him know--it may be tomorrow before he hears back from me, as RAS is ooo for some time today.  Lucas Berry is agreeable with this/fim

## 2014-03-17 NOTE — Telephone Encounter (Signed)
Patient has additional questions regarding returning back to work and [employer]. Please call and advise.

## 2014-03-17 NOTE — Telephone Encounter (Signed)
Spoke with Lucas Berry and per RAS, advised it is ok to stay out of work until he has started Gilenya.  Since I have not received his Gilenya starter pack yet, I am not able to schedule his fdo.  He is aware that a definite return to work date can't be given.  Note putting him out of work for 1-3 weeks is up front for him to pick up tomorrow.  He verbalized understanding of same/fim

## 2014-03-22 ENCOUNTER — Encounter: Payer: Self-pay | Admitting: *Deleted

## 2014-03-22 NOTE — Telephone Encounter (Signed)
Patient checking status of Short Term disability form faxed over last week.  Please call and advise.

## 2014-03-22 NOTE — Telephone Encounter (Signed)
FMLA paperwork on RAS desk/fim

## 2014-03-22 NOTE — Telephone Encounter (Signed)
Spoke with Caryn Bee and advised I have not received any fmla paperwork for him.  He will ask his employer to re-fax that to [phone], to my attn/fim

## 2014-03-24 ENCOUNTER — Telehealth: Payer: Self-pay | Admitting: Neurology

## 2014-03-24 NOTE — Telephone Encounter (Signed)
Lucas Berry with [facility] @ [phone], questioning if Rx Gilenya is valid to process.  Please call and advise.

## 2014-03-25 NOTE — Telephone Encounter (Signed)
I spoke with Lucas Berry this morning and advised fmla paperwork is complete.  Per his request, I have faxed paperwork and ov notes to [employer] at fax # [phone], and I have mailed a copy to him at home address listed in chart, for his records/fim

## 2014-03-25 NOTE — Telephone Encounter (Signed)
Spoke with [name], pharmacist with [facility], and verified Gilenya rx.  I also have advised her that pt.  has not had fdo yet, and they should not ship med to him until fdo has been completed.  Advised [employer] will notify them once this has been done.  She verbalized understanding of same.  I also spoke with our Gilenya RN, Tim, at [facility], and advised that I have not received [name]'s initial 2 wk. supply of Gilenya, for his fdo.  Tim sts. this will be delivered to our office by 1030 tomorrow am (03-26-14).  I dd advise him that our office closes at 2 on Fridays.Lucas Berry

## 2014-03-29 ENCOUNTER — Telehealth: Payer: Self-pay | Admitting: *Deleted

## 2014-03-29 ENCOUNTER — Telehealth: Payer: Self-pay | Admitting: Neurology

## 2014-03-29 NOTE — Telephone Encounter (Signed)
Pt is calling back stating that he has not taken the eye exam, but he had an eye check up in the last 6 months and wants to know if he has to have another one before he starts the medicine?  Please call and advise.

## 2014-03-29 NOTE — Telephone Encounter (Signed)
I called back.  The request is under review at this time.

## 2014-03-29 NOTE — Telephone Encounter (Signed)
Spoke with Caryn Bee and advised no other eye exam needed prior to Gilenya fdo--reminded him he will need an eye exam 3 mos. after fdo.  He verbalized understanding of same.  Gilenya fdo sched. for 04-05-14, to arrive at 8am/fim

## 2014-03-29 NOTE — Telephone Encounter (Signed)
Gilenya 2 wk. start pk. received.  Spoke with Caryn Bee and sched. fdo for Monday 04-05-14--he is to arrive at 8am/fim

## 2014-03-29 NOTE — Telephone Encounter (Signed)
Marchelle Folks called and stated prior authorization is need for patient's Gilenya. Case # [id number]

## 2014-03-31 ENCOUNTER — Encounter: Payer: Self-pay | Admitting: *Deleted

## 2014-03-31 ENCOUNTER — Telehealth: Payer: Self-pay | Admitting: Neurology

## 2014-03-31 ENCOUNTER — Telehealth: Payer: Self-pay

## 2014-03-31 NOTE — Telephone Encounter (Signed)
Patient is calling to get patient assistance with the co pay for Gilenya. Please call and advise. Thank you.

## 2014-03-31 NOTE — Telephone Encounter (Signed)
I called back to provide patient with phone number for Gilenya PAP [phone].  Got no answer.  Left message.

## 2014-03-31 NOTE — Telephone Encounter (Signed)
[employer] has approved the request for coverage on Gilenya effective until 03/31/2015 Ref # [id number]

## 2014-04-05 ENCOUNTER — Ambulatory Visit (INDEPENDENT_AMBULATORY_CARE_PROVIDER_SITE_OTHER): Payer: BLUE CROSS/BLUE SHIELD | Admitting: Neurology

## 2014-04-05 ENCOUNTER — Encounter: Payer: Self-pay | Admitting: Neurology

## 2014-04-05 VITALS — BP 170/96 | HR 104 | Resp 16 | Ht 69.0 in | Wt 232.0 lb

## 2014-04-05 DIAGNOSIS — G35 Multiple sclerosis: Secondary | ICD-10-CM

## 2014-04-05 DIAGNOSIS — R270 Ataxia, unspecified: Secondary | ICD-10-CM

## 2014-04-05 DIAGNOSIS — Z79899 Other long term (current) drug therapy: Secondary | ICD-10-CM

## 2014-04-05 DIAGNOSIS — R200 Anesthesia of skin: Secondary | ICD-10-CM | POA: Diagnosis not present

## 2014-04-06 ENCOUNTER — Encounter: Payer: Self-pay | Admitting: *Deleted

## 2014-04-06 ENCOUNTER — Encounter: Payer: Self-pay | Admitting: Neurology

## 2014-04-06 DIAGNOSIS — Z79899 Other long term (current) drug therapy: Secondary | ICD-10-CM | POA: Insufficient documentation

## 2014-04-06 NOTE — Progress Notes (Signed)
[facility]  PATIENT: Lucas Berry DOB: [birth year]     HISTORICAL  CHIEF COMPLAINT:  Chief Complaint  Patient presents with  . Multiple Sclerosis    Gilenya fdo--0805--EKG done.  Pt., Lucas Berry brother and Lucas Berry mother to sleep lab room.  1610-960/[id number].  Caryn Bee sts. Lucas Berry is nervous about fdo.  0849--Dr. Sater viewed EKG, gave approval to proceed with fdo.  Gilenya 0.5mg  po given.  0920-154/80-88-12. 0935-RAS speaking with pt. 0955-146/74-80-14.  1025-140/80-68-12-pt. watching t.v./playing with smartphone, visiting with mother/brother.  1055-RAS speaking with pt. 142/84-84-12 1113-HR 80bpm. Resp. 16, even/unlabored. Skin w/d/pink. 1150-136/84-80-12.   . High Risk Medication    1216-146/88-72-12. 1255-152/84-84-12-eating lunch. 1330-156/78-72-14. 1400-152/90-80-12. RAS speaking with pt. 1440-144/84-72-12.  1450-EKG done, viewed by RAS, approval to d/c pt. received.  1500-Pt. d/c home with mother/brother, is ambulatory out of office without difficulty/fim    HISTORY OF PRESENT ILLNESS:   Lucas Berry is a 29 yo man who was diagnosed with multiple sclerosis last month.   Lucas Berry has opted to begin Gilenya therapy.   MS Hx:    Lucas Berry presented in early December 2015 with a several month history of dizziness, ataxic gait and hand numbness. MRI in early December 2015 that I personally reviewed showed multiple white matter foci including several in the posterior fossa (left middle cerebellar peduncle, pons, medulla) and periventricular foci in both hemispheres. Also had a spine focus at C2. There were no acute lesions (no enhancement, no DWI brightness). In late December blood work showed normal or negative vasculitis labs, ACE, Lyme, HIV, RPR, pan ANCA, and anti-NMO antibody. Visual evoked potentials were abnormal showing prolongation of the P100 to about 130 ms on either side.  CSF is abnormal showing > 5 oligoclonal bands and elevated IgG index of 0.78.  Gait:    Lucas Berry notes  issues with balance and mild weakness but is doing better than at the last visit.   Lucas Berry does not need a cane.    Lucas Berry is able to run some and climb stairs/ladders. Lucas Berry is not stumbling. Lucas Berry denies leg numbness but has had left hand numbness over a year.   Headache:  Lucas Berry needed a blood patch after the LP. HA's are present still but better when Lucas Berry is upright.   Bladder:  Lucas Berry has some urinary frequency But not bad enough to consider treatment.   Vision:   Lucas Berry denies any vision issues the last few months but notes Lucas Berry had trouble getting contacts with good correction last year. Lucas Berry sees [facility].   We discussed him needing a f/u eye exam in 3-4 months.     Fatigue/sleep/mood/cognition:  Lucas Berry notes some fatigue with less physical endurance but still does well. Lucas Berry denies any problems with cognition. Lucas Berry denies any issue with depression or anxiety. Hesleeps well  Lucas Berry had a baseline EKG at 0830.   It was normal -  Normal rate, rhythm, intervals and no ischemic change.      Lucas Berry took Gilenya  At  0900. After the initial visit, over the next 6 hours, I saw him 6 more times for 5-10 minutes each time (40-45 minutes).    At each encounter, Lucas Berry denied chest pain, shortness of breath, palpitations, lightheadedness, illness, headache, sweats or other symptoms. At each encounter, I checked Lucas Berry pulse and rhythm. Lucas Berry was in normal sinus rhythm each time with pulse is between 65 and 80. Lucas Berry had a post-treatment EKG at 1500.   It showed normal rate, rhythm, intervals and no ischemic change.  REVIEW OF SYSTEMS: Constitutional: No fevers, chills, sweats, or change in appetite Eyes: No visual changes, double vision, eye pain Ear, nose and throat: No hearing loss, ear pain, nasal congestion, sore throat Cardiovascular: No chest pain, palpitations Respiratory: No shortness of breath at rest or with exertion. No wheezes GastrointestinaI: No nausea, vomiting, diarrhea, abdominal pain, fecal  incontinence Genitourinary: Mild frequency. No incontinence.. Musculoskeletal: No neck pain, back pain Integumentary: No rash, pruritus, skin lesions Neurological: as above Psychiatric: No depression at this time. No anxiety Endocrine: No palpitations, diaphoresis, change in appetite, change in weigh or increased thirst Hematologic/Lymphatic: No anemia, purpura, petechiae. Allergic/Immunologic: No itchy/runny eyes, nasal congestion, recent allergic reactions, rashes   ALLERGIES: No Known Allergies  HOME MEDICATIONS:  Current outpatient prescriptions:  .  amLODipine (NORVASC) 5 MG tablet, Take 5 mg by mouth daily., Disp: , Rfl: 0 .  Fingolimod HCl 0.5 MG CAPS, Take 0.5 mg by mouth daily., Disp: , Rfl:   PAST MEDICAL HISTORY: Past Medical History  Diagnosis Date  . Carpal tunnel syndrome   . Multiple sclerosis   . Movement disorder   . Hypertension   . Vision abnormalities     PAST SURGICAL HISTORY: Past Surgical History  Procedure Laterality Date  . Mouth surgery  10 years ago    FAMILY HISTORY: Family History  Problem Relation Age of Onset  . High blood pressure Mother   . Cancer Maternal Grandmother   . High blood pressure Father     SOCIAL HISTORY:  History   Social History  . Marital Status: Single    Spouse Name: N/A  . Number of Children: 2  . Years of Education: Assoc   Occupational History  .  Other    [employer]   Social History Main Topics  . Smoking status: Never Smoker   . Smokeless tobacco: Never Used  . Alcohol Use: 0.0 oz/week    0 Standard drinks or equivalent per week     Comment: weekly  . Drug Use: No  . Sexual Activity: Not on file   Other Topics Concern  . Not on file   Social History Narrative   Patient lives at home with Lucas Berry family.   Caffeine use: occasionally   Patient has a college education    Patient has 2 children    Patient is right handed      PHYSICAL EXAM  Filed Vitals:   04/05/14 1506  BP:  170/96  Pulse: 104  Resp: 16  Height: 5\' 9"  (1.753 m)  Weight: 232 lb (105.235 kg)    Body mass index is 34.24 kg/(m^2).   General: The patient is well-developed and well-nourished and in no acute distress  Neck: The neck is supple, no carotid bruits are noted. The neck is nontender.  Skin: Extremities are without significant edema.   Neurologic Exam  Mental status: The patient is alert and oriented x 3 at the time of the examination. The patient has apparent normal recent and remote memory, with an apparently normal attention span and concentration ability. Speech is normal.  Cranial nerves: Extraocular movements are full.  Facial symmetry is present. There is good facial sensation to soft touch bilaterally.Facial strength is normal. Trapezius and sternocleidomastoid strength is normal. No dysarthria is noted. The tongue is midline, and the patient has symmetric elevation of the soft palate. No obvious hearing deficits are noted.  Motor: Muscle bulk is normal. Tone is normal. Strength is 5 / 5 in all 4 extremities.  Sensory: Sensory testing is intact to pinprick, soft touch and vibration sensation in all 4 extremities.  Coordination: Cerebellar testing reveals good finger-nose-finger and mildly reduced heel-to-shin bilaterally.  Gait and station: Station is normal. Gait is normal. Tandem gait is normal. Romberg is negative.   Reflexes: Deep tendon reflexes are symmetric and normal bilaterally.    DIAGNOSTIC DATA (LABS, IMAGING, TESTING) - I reviewed patient records, labs, notes, testing and imaging myself where available.  Lab Results  Component Value Date   WBC 5.0 12/22/2013   HGB 14.8 12/22/2013   HCT 44.6 12/22/2013   MCV 78* 12/22/2013   PLT 279 12/22/2013      Component Value Date/Time   NA 139 12/22/2013 0815   K 4.1 12/22/2013 0815   CL 103 12/22/2013 0815   CO2 23 12/22/2013 0815   GLUCOSE 104* 12/22/2013 0815   BUN 9 12/22/2013 0815    CREATININE 1.12 12/22/2013 0815   CALCIUM 9.3 12/22/2013 0815   PROT 6.5 12/22/2013 0815   AST 29 12/22/2013 0815   ALT 33 12/22/2013 0815   ALKPHOS 61 12/22/2013 0815   BILITOT 0.6 12/22/2013 0815   GFRNONAA 90 12/22/2013 0815   GFRAA 104 12/22/2013 0815   No results found for: CHOL, HDL, LDLCALC, LDLDIRECT, TRIG, CHOLHDL No results found for: ZOXW9U Lab Results  Component Value Date   VITAMINB12 469 12/22/2013   Lab Results  Component Value Date   TSH 2.300 12/22/2013       ASSESSMENT AND PLAN  Multiple sclerosis  Numbness  Ataxia  High risk medication use     Summary, Willmer Fellers is a 29 year old man who presents with a fairly aggressive multiple sclerosis. Lucas Berry is recovering from Lucas Berry initial exacerbations and is better today than at Lucas Berry previous visit. We have previously discussed therapeutic options and Lucas Berry wanted to initiate Gilenya. Today, we did the first day observation and Lucas Berry tolerated the 0.5 mg dose without any difficulty. Lucas Berry did not have any prolonged bradycardia and there were no arrhythmias. Lucas Berry had no lightheadedness, palpitations or other symptoms.  I again went over the potential risks of Gilenya including small chance of severe infection such as PML. There is a higher chance of less severe issues such as macular edema and Lucas Berry will have a follow-up eye exam in 3 or 4 months. I also discussed the importance of not missing any doses of medicine, especially during the next 2 weeks.  Lucas Berry will return to see me in 3 months or sooner if Lucas Berry has new or worsening neurologic symptoms.   Richard A. Epimenio Foot, MD, PhD 04/06/2014, 11:12 AM Certified in Neurology, Clinical Neurophysiology, Sleep Medicine, Pain Medicine and Neuroimaging  [facility] [address] [place], [place] [address] [phone]

## 2014-06-16 ENCOUNTER — Telehealth: Payer: Self-pay | Admitting: *Deleted

## 2014-06-16 NOTE — Telephone Encounter (Signed)
Patient called back he will not be able to make appt. I will cancel and make note.Lucas Berry He states that he will call back to r/s next week. No need to return call.

## 2014-06-16 NOTE — Telephone Encounter (Signed)
noted.  I will let RAS know/fim

## 2014-06-16 NOTE — Telephone Encounter (Signed)
Patient called returning Faith's phone call. Please call and advise. Patient can be reached at [phone].

## 2014-06-16 NOTE — Telephone Encounter (Signed)
I have spoken with Lucas Berry this afternoon and per RAS, advised of the need for an ov to investigate reason for eye pain.  He is agreeable.  Appt. given for 06-17-14 at 1620--he will try to get this time off of work, will call me back if he can't/fim

## 2014-06-16 NOTE — Telephone Encounter (Signed)
LMTC.  I spoke with Dr. Martha Clan this afternoon--he sts. he saw Lucas Berry in the office today for c/o right eye pain with extreme left or right gaze.  No optic neuritis seen. Faiz started Gilenya 2 mos. ago.  I will speak with RAS to see how he would like to proceed./fim

## 2014-06-17 ENCOUNTER — Ambulatory Visit: Payer: Self-pay | Admitting: Neurology

## 2014-06-21 ENCOUNTER — Ambulatory Visit (INDEPENDENT_AMBULATORY_CARE_PROVIDER_SITE_OTHER): Payer: BLUE CROSS/BLUE SHIELD | Admitting: Neurology

## 2014-06-21 ENCOUNTER — Encounter: Payer: Self-pay | Admitting: Neurology

## 2014-06-21 VITALS — BP 166/100 | HR 74 | Resp 14 | Ht 69.0 in | Wt 236.4 lb

## 2014-06-21 DIAGNOSIS — H469 Unspecified optic neuritis: Secondary | ICD-10-CM

## 2014-06-21 DIAGNOSIS — G35 Multiple sclerosis: Secondary | ICD-10-CM | POA: Diagnosis not present

## 2014-06-21 NOTE — Progress Notes (Signed)
[facility]  PATIENT: Lucas Berry DOB: 05-09-1985     HISTORICAL  CHIEF COMPLAINT:  Chief Complaint  Patient presents with  . Multiple Sclerosis    Sts. about 7-10 days ago, he noticed left eye pain with extreme left or right gaze.  He also noted brief sharp pain to left temmporal region with sudden movements.  He saw his opthalmologist (Dr. Martha Clan) last week, no obvious optic neuritis.  Today he reports sudden pain in left temporal region is less often, but he still has left eye pain with left or right gaze, and blurry vision left eye only/fim  . Visual Disturbance    HISTORY OF PRESENT ILLNESS:  Lucas Berry is a 29 year old man who was diagnosed with MS earlier this year. Initially, he presented with gait issues. About 2 months ago he was started on Gilenya. About 10 or 11 days ago he had the onset of left eye pain followed by blurry vision out of the left eye. When he looks out of left eye, he feels like looking at the sun for a few seconds and then trying to look straight ahead.   He notes no problems at all with the right eye. He has pain in the left eye when he looks in any direction. This was worse last week and this week. He did see his ophthalmologist last week and there was no intraocular pathology and no definite optic neuritis noted.   He feels visual acuity out of the left eye is about the same this week as last week. He feels he can see peripheral vision in all 4 quadrants.   No redness in the eyes and no tearing. No diplopia.  He denies any change in his gait.   Generally the gait is good and he denies any weakness in the legs.  He notes no new numbness or clumsiness. He has no new or worsening bladder issues. He denies fatigue or cognitive disturbance. Mood is fine.      ROS:  Out of a complete 14 system review of symptoms, the patient complains only of the following symptoms, and all other reviewed systems are negative.  See above.     ALLERGIES: No Known Allergies  HOME MEDICATIONS:  Current outpatient prescriptions:  .  amLODipine (NORVASC) 5 MG tablet, Take 5 mg by mouth daily., Disp: , Rfl: 0 .  Fingolimod HCl 0.5 MG CAPS, Take 0.5 mg by mouth daily., Disp: , Rfl:   PAST MEDICAL HISTORY: Past Medical History  Diagnosis Date  . Carpal tunnel syndrome   . Multiple sclerosis   . Movement disorder   . Hypertension   . Vision abnormalities     PAST SURGICAL HISTORY: Past Surgical History  Procedure Laterality Date  . Mouth surgery  10 years ago    FAMILY HISTORY: Family History  Problem Relation Age of Onset  . High blood pressure Mother   . Cancer Maternal Grandmother   . High blood pressure Father     SOCIAL HISTORY:  History   Social History  . Marital Status: Single    Spouse Name: N/A  . Number of Children: 2  . Years of Education: Assoc   Occupational History  .  Other    [employer]   Social History Main Topics  . Smoking status: Never Smoker   . Smokeless tobacco: Never Used  . Alcohol Use: 0.0 oz/week    0 Standard drinks or equivalent per week     Comment: weekly  .  Drug Use: No  . Sexual Activity: Not on file   Other Topics Concern  . Not on file   Social History Narrative   Patient lives at home with his family.   Caffeine use: occasionally   Patient has a college education    Patient has 2 children    Patient is right handed      PHYSICAL EXAM  Filed Vitals:   06/21/14 1331  BP: 166/100  Pulse: 74  Resp: 14  Height:  (1.753 m)  Weight: 236 lb 6.4 oz (107.23 kg)    Body mass index is 34.89 kg/(m^2).   General: The patient is well-developed and well-nourished and in no acute distress  Eyes:  Funduscopic exam shows normal optic discs and retinal vessels.    Visual acuity is 20/30 on the right and worsened 20/200 on the left  Neck: The neck is supple, no carotid bruits are noted.  The neck is nontender.  Skin: Extremities are without  significant edema.  Musculoskeletal:  Back is nontender  Neurologic Exam  Mental status: The patient is alert and oriented x 3 at the time of the examination. The patient has apparent normal recent and remote memory, with an apparently normal attention span and concentration ability.   Speech is normal.  Cranial nerves: Extraocular movements are full. Pupils show mild left APD.   Visual fields are full to finger countingl.  Facial symmetry is present. There is good facial sensation to soft touch bilaterally.Facial strength is normal.  Trapezius and sternocleidomastoid strength is normal. No dysarthria is noted.   Motor:  Muscle bulk is normal.   Tone is normal. Strength is  5 / 5 in all 4 extremities.   Coordination: Cerebellar testing reveals good finger-nose-finger.  Gait and station: Station is normal.   Gait is normal. Tandem gait is mildly wide . Romberg is negative.      DIAGNOSTIC DATA (LABS, IMAGING, TESTING) - I reviewed patient records, labs, notes, testing and imaging myself where available.  Lab Results  Component Value Date   WBC 5.0 12/22/2013   HGB 14.8 12/22/2013   HCT 44.6 12/22/2013   MCV 78* 12/22/2013   PLT 279 12/22/2013      Component Value Date/Time   NA 139 12/22/2013 0815   K 4.1 12/22/2013 0815   CL 103 12/22/2013 0815   CO2 23 12/22/2013 0815   GLUCOSE 104* 12/22/2013 0815   BUN 9 12/22/2013 0815   CREATININE 1.12 12/22/2013 0815   CALCIUM 9.3 12/22/2013 0815   PROT 6.5 12/22/2013 0815   AST 29 12/22/2013 0815   ALT 33 12/22/2013 0815   ALKPHOS 61 12/22/2013 0815   BILITOT 0.6 12/22/2013 0815   GFRNONAA 90 12/22/2013 0815   GFRAA 104 12/22/2013 0815   No results found for: CHOL, HDL, LDLCALC, LDLDIRECT, TRIG, CHOLHDL No results found for: [id number] Lab Results  Component Value Date   VITAMINB12 469 12/22/2013   Lab Results  Component Value Date   TSH 2.300 12/22/2013       ASSESSMENT AND PLAN  Multiple sclerosis  Optic  neuritis   1.   IV Solu-Medrol, 1 g 3 days, first dose today. 2.   As he has only been on Gilenya for 2 months, I'm reluctant to consider this a failure of his therapy if he has another exacerbation we would need to consider a change to another medication 3.   He will return tomorrow for his next IV Solu-Medrol and call if he  has new or worsening neurologic symptoms.    Lucas Berry A. Epimenio Foot, MD, PhD 06/21/2014, 1:37 PM Certified in Neurology, Clinical Neurophysiology, Sleep Medicine, Pain Medicine and Neuroimaging  [facility] [address] [place], [place] [address] [phone]

## 2014-06-21 NOTE — Patient Instructions (Signed)
We will do IV steroid today for your optic neuritis. You will be scheduled to return for 2 more days of IV treatment.  Call us if he notes any new or worsening neurologic symptoms.

## 2014-08-05 ENCOUNTER — Ambulatory Visit (INDEPENDENT_AMBULATORY_CARE_PROVIDER_SITE_OTHER): Payer: BLUE CROSS/BLUE SHIELD | Admitting: Neurology

## 2014-08-05 ENCOUNTER — Encounter: Payer: Self-pay | Admitting: Neurology

## 2014-08-05 VITALS — BP 145/97 | HR 75 | Ht 69.0 in | Wt 237.2 lb

## 2014-08-05 DIAGNOSIS — G35 Multiple sclerosis: Secondary | ICD-10-CM | POA: Diagnosis not present

## 2014-08-05 DIAGNOSIS — H469 Unspecified optic neuritis: Secondary | ICD-10-CM | POA: Diagnosis not present

## 2014-08-05 DIAGNOSIS — R3911 Hesitancy of micturition: Secondary | ICD-10-CM | POA: Insufficient documentation

## 2014-08-05 DIAGNOSIS — Z79899 Other long term (current) drug therapy: Secondary | ICD-10-CM

## 2014-08-05 DIAGNOSIS — R200 Anesthesia of skin: Secondary | ICD-10-CM | POA: Diagnosis not present

## 2014-08-05 DIAGNOSIS — R270 Ataxia, unspecified: Secondary | ICD-10-CM | POA: Diagnosis not present

## 2014-08-05 NOTE — Progress Notes (Signed)
[facility]  PATIENT: Lucas Berry DOB: 01/22/1985     HISTORICAL  CHIEF COMPLAINT:  Chief Complaint  Patient presents with  . Follow-up    Pt in room 14 by himself. Doing well on Gilenya. No complaints at this time with medication. Getting over a sinus infection.     HISTORY OF PRESENT ILLNESS:  Lucas Berry is a 29 year old man who was diagnosed with MS in early 2016.   He has been on Gilenya x 4 months and tolerates it well.   He had a bout of optic neuritis in the left eye about 6 weeks after stating Gilenya and has not had any trouble since.   He tolerates Gilenya well.     Gait/strength/sensation:   He presented with gait issues.   He still notes some difficulty with balance but does not need support and has not fallen.     He denies any definite weakness.  He still has left hand numbness.    Vision:   He feels he is back to baseline after the ON in June.   No diplopia.  Bladder:    He notes some hesitancy but d denies urgency or frequency.    He has nocturia once most nights.  Fatigue/sleep:   He denies much fatigue but has not returned to gym exercise.   He takes a walk with his girlfriend nightly.    He is sleeping well most nights.  Mood   He denies any problems with depression or anxiety.   He has no cognitive issues.     Knee pain:   He notes some knee pain in both knees, especially if he climbs stairs.     ---------------------------------------------------------------------------------------------------- MRI Brain 12/10/1  IMPRESSION:  Abnormal MRI brain (with and without) demonstrating: 1. Multiple round and ovoid, periventricular and subcortical, pontine, middle cerebelllar peduncle, medullary and upper C2 cervical spinal cord T2 hyperintense lesions. Findings are suspicious for chronic demyelinating plaques. Other autoimmune, inflammatory or post-infectious etiologies can have a similar appearance. 2. No abnormal enhancing  lesions.   ROS:  Out of a complete 14 system review of symptoms, the patient complains only of the following symptoms, and all other reviewed systems are negative.  He notes difficulty with urination and has had a cough   ALLERGIES: No Known Allergies  HOME MEDICATIONS:  Current outpatient prescriptions:  .  amLODipine (NORVASC) 5 MG tablet, Take 5 mg by mouth daily., Disp: , Rfl: 0 .  Fingolimod HCl 0.5 MG CAPS, Take 0.5 mg by mouth daily., Disp: , Rfl:  .  HYDROcodone-homatropine (HYCODAN) 5-1.5 MG/5ML syrup, take 1 teaspoonful by mouth every 4 to 6 hours if needed for cough, Disp: , Rfl: 0  PAST MEDICAL HISTORY: Past Medical History  Diagnosis Date  . Carpal tunnel syndrome   . Multiple sclerosis   . Movement disorder   . Hypertension   . Vision abnormalities     PAST SURGICAL HISTORY: Past Surgical History  Procedure Laterality Date  . Mouth surgery  10 years ago    FAMILY HISTORY: Family History  Problem Relation Age of Onset  . High blood pressure Mother   . Cancer Maternal Grandmother   . High blood pressure Father     SOCIAL HISTORY:  History   Social History  . Marital Status: Single    Spouse Name: N/A  . Number of Children: 2  . Years of Education: Assoc   Occupational History  .  Other    [employer]  Social History Main Topics  . Smoking status: Never Smoker   . Smokeless tobacco: Never Used  . Alcohol Use: 0.0 oz/week    0 Standard drinks or equivalent per week     Comment: weekly  . Drug Use: No  . Sexual Activity: Not on file   Other Topics Concern  . Not on file   Social History Narrative   Patient lives at home with his family.   Caffeine use: occasionally   Patient has a college education    Patient has 2 children    Patient is right handed      PHYSICAL EXAM  Filed Vitals:   08/05/14 1411  BP: 145/97  Pulse: 75  Height:  (1.753 m)  Weight: 237 lb 3.2 oz (107.593 kg)    Body mass index is 35.01  kg/(m^2).   General: The patient is well-developed and well-nourished and in no acute distress  Eyes:  Funduscopic exam shows normal optic discs and retinal vessels.      Neck: The neck is supple, no carotid bruits are noted.  The neck is nontender.  Skin: Extremities are without significant edema.  Musculoskeletal:  Back is nontender  Neurologic Exam  Mental status: The patient is alert and oriented x 3 at the time of the examination. The patient has apparent normal recent and remote memory, with an apparently normal attention span and concentration ability.   Speech is normal.  Cranial nerves: Extraocular movements are full. Pupils show mild left APD.  He notes mild color desaturation out of left eye. Facial symmetry is present. There is good facial sensation to soft touch bilaterally.Facial strength is normal.  Trapezius and sternocleidomastoid strength is normal. No dysarthria is noted.   Motor:  Muscle bulk is normal.   Tone is normal. Strength is  5 / 5 in all 4 extremities.   Coordination: Cerebellar testing reveals good finger-nose-finger.  Gait and station: Station is normal.   Gait is normal. Tandem gait is mildly wide . Romberg is negative.      DIAGNOSTIC DATA (LABS, IMAGING, TESTING) - I reviewed patient records, labs, notes, testing and imaging myself where available.  Lab Results  Component Value Date   WBC 5.0 12/22/2013   HGB 14.8 12/22/2013   HCT 44.6 12/22/2013   MCV 78* 12/22/2013   PLT 279 12/22/2013      Component Value Date/Time   NA 139 12/22/2013 0815   K 4.1 12/22/2013 0815   CL 103 12/22/2013 0815   CO2 23 12/22/2013 0815   GLUCOSE 104* 12/22/2013 0815   BUN 9 12/22/2013 0815   CREATININE 1.12 12/22/2013 0815   CALCIUM 9.3 12/22/2013 0815   PROT 6.5 12/22/2013 0815   AST 29 12/22/2013 0815   ALT 33 12/22/2013 0815   ALKPHOS 61 12/22/2013 0815   BILITOT 0.6 12/22/2013 0815   GFRNONAA 90 12/22/2013 0815   GFRAA 104 12/22/2013 0815   No  results found for: CHOL, HDL, LDLCALC, LDLDIRECT, TRIG, CHOLHDL No results found for: ZOXW9U Lab Results  Component Value Date   VITAMINB12 469 12/22/2013   Lab Results  Component Value Date   TSH 2.300 12/22/2013       ASSESSMENT AND PLAN  Multiple sclerosis - Plan: CBC with Differential/Platelet, Hepatic function panel  Optic neuritis  High risk medication use - Plan: CBC with Differential/Platelet, Hepatic function panel  Numbness  Ataxia  Urinary hesitancy   1.  Continue Gilenya. I will check a CBC with differential to  assess the degree of lymphopenia. 2.   We discussed that if the urinary hesitancy worsens I would consider a medication (Flomax or similar)..    3.  He will return to see me in 4 months or sooner if there are new or worsening neurologic symptoms. Around the time of the next visit we will go ahead and check a repeat MRI of the brain to assess for the possibility of subclinical progression. If we see subclinical progression, we would need to consider a different medication.  1.   IV Solu-Medrol, 1 g 3 days, first dose today. 2.   As he has only been on Gilenya for 2 months, I'm reluctant to consider this a failure of his therapy if he has another exacerbation we would need to consider a change to another medication 3.   He will return tomorrow for his next IV Solu-Medrol and call if he has new or worsening neurologic symptoms.    Richard A. Epimenio Foot, MD, PhD 08/05/2014, 2:24 PM Certified in Neurology, Clinical Neurophysiology, Sleep Medicine, Pain Medicine and Neuroimaging  [facility] [address], [address] [place], [place] [address] [phone]

## 2014-08-06 LAB — CBC WITH DIFFERENTIAL/PLATELET
BASOS: 0 %
Basophils Absolute: 0 10*3/uL (ref 0.0–0.2)
EOS (ABSOLUTE): 0 10*3/uL (ref 0.0–0.4)
EOS: 0 %
Hematocrit: 35 % — ABNORMAL LOW (ref 37.5–51.0)
Hemoglobin: 10.5 g/dL — ABNORMAL LOW (ref 12.6–17.7)
Immature Grans (Abs): 0 10*3/uL (ref 0.0–0.1)
Immature Granulocytes: 0 %
LYMPHS ABS: 0.2 10*3/uL — AB (ref 0.7–3.1)
LYMPHS: 5 %
MCH: 18.9 pg — AB (ref 26.6–33.0)
MCHC: 30 g/dL — ABNORMAL LOW (ref 31.5–35.7)
MCV: 63 fL — ABNORMAL LOW (ref 79–97)
MONOS ABS: 0.4 10*3/uL (ref 0.1–0.9)
Monocytes: 9 %
NEUTROS ABS: 3.5 10*3/uL (ref 1.4–7.0)
Neutrophils: 86 %
Platelets: 300 10*3/uL (ref 150–379)
RBC: 5.55 x10E6/uL (ref 4.14–5.80)
RDW: 22.5 % — ABNORMAL HIGH (ref 12.3–15.4)
WBC: 4.1 10*3/uL (ref 3.4–10.8)

## 2014-08-06 LAB — HEPATIC FUNCTION PANEL
ALT: 37 IU/L (ref 0–44)
AST: 22 IU/L (ref 0–40)
Albumin: 4.7 g/dL (ref 3.5–5.5)
Alkaline Phosphatase: 75 IU/L (ref 39–117)
Bilirubin Total: 0.4 mg/dL (ref 0.0–1.2)
Bilirubin, Direct: 0.11 mg/dL (ref 0.00–0.40)
Total Protein: 6.6 g/dL (ref 6.0–8.5)

## 2014-08-10 ENCOUNTER — Telehealth: Payer: Self-pay | Admitting: *Deleted

## 2014-08-10 NOTE — Telephone Encounter (Signed)
-----   Message from Hillis Range, RN sent at 08/09/2014  8:20 AM EDT -----   ----- Message -----    From: Asa Lente, MD    Sent: 08/06/2014   8:24 AM      To: Hillis Range, RN  His liver tests looked good. His white blood cell count (lymphocytes) was low but that's there common with GilenyaThe blood count shows that he has an anemia that was not present 7 months ago. He might be iron deficient. If he has a primary care doctor please  forward the labs or let me know.  If he does not have a primary care doctor, I will want to do some additional blood tests

## 2014-08-10 NOTE — Telephone Encounter (Signed)
I have spoken with Lucas Berry this afternoon and per RAS, advised that lft's are ok, lymphocytes are slightly low, as expected while on Gilenya, and that labs indicate a new anemia, possibly fe def.  I have advised he should f/u with pcp for tx. of anemia.  He verbalized understanding of same, sts. his pcp is Dr. Wynelle Link at [facility].  I have faxed his cbc to Dr. Wynelle Link at fax # [phone]

## 2014-08-10 NOTE — Telephone Encounter (Signed)
Patient returned your call.

## 2014-08-10 NOTE — Telephone Encounter (Signed)
LMTC./fim 

## 2014-08-10 NOTE — Telephone Encounter (Signed)
-----   Message from Emma L King, RN sent at 08/09/2014  8:20 AM EDT -----   ----- Message -----    From: Richard A Sater, MD    Sent: 08/06/2014   8:24 AM      To: Emma L King, RN  His liver tests looked good. His white blood cell count (lymphocytes) was low but that's there common with GilenyaThe blood count shows that he has an anemia that was not present 7 months ago. He might be iron deficient. If he has a primary care doctor please  forward the labs or let me know.  If he does not have a primary care doctor, I will want to do some additional blood tests 

## 2014-11-17 ENCOUNTER — Ambulatory Visit (INDEPENDENT_AMBULATORY_CARE_PROVIDER_SITE_OTHER): Payer: BLUE CROSS/BLUE SHIELD | Admitting: Family Medicine

## 2014-11-17 VITALS — BP 128/84 | HR 98 | Temp 98.1°F | Resp 16 | Ht 69.0 in | Wt 236.8 lb

## 2014-11-17 DIAGNOSIS — Z0000 Encounter for general adult medical examination without abnormal findings: Secondary | ICD-10-CM

## 2014-11-17 NOTE — Progress Notes (Signed)
  Patient ID: Lucas Berry, male   DOB: 04/28/1985, 29 y.o.   MRN: [id number] Subjective:     Lucas Berry is a 29 y.o. male and is here for a comprehensive physical exam. The patient reports no problems.  Pt is previously healthy, denies hx of DM but does have Hx HTN with extensive FHx of this as well.  Currently on Norvasc prescribed by PCP Dr. Wynelle Link at [place].  Dx also with multiple sclerosis and followed by Dr. Epimenio Foot at [facility].    Social History   Social History  . Marital Status: Single    Spouse Name: N/A  . Number of Children: 2  . Years of Education: Assoc   Occupational History  .  Other    [employer]   Social History Main Topics  . Smoking status: Never Smoker   . Smokeless tobacco: Never Used  . Alcohol Use: 0.0 oz/week    0 Standard drinks or equivalent per week     Comment: weekly  . Drug Use: No  . Sexual Activity: Not on file   Other Topics Concern  . Not on file   Social History Narrative   Patient lives at home with his family.   Caffeine use: occasionally   Patient has a college education    Patient has 2 children    Patient is right handed    Health Maintenance  Topic Date Due  . TETANUS/TDAP  08/18/2005  . INFLUENZA VACCINE  08/02/2014  . HIV Screening  Completed    The following portions of the patient's history were reviewed and updated as appropriate: allergies, current medications, past family history, past medical history, past social history, past surgical history and problem list.  Review of Systems Pertinent items are noted in HPI.   Objective:   Filed Vitals:   11/17/14 1849  BP: 128/84  Pulse: 98  Temp: 98.1 F (36.7 C)  Resp: 16     BP 128/84 mmHg  Pulse 98  Temp(Src) 98.1 F (36.7 C) (Oral)  Resp 16  Ht 5\' 9"  (1.753 m)  Wt 236 lb 12.8 oz (107.412 kg)  BMI 34.95 kg/m2  SpO2 98% General appearance: alert, cooperative and appears stated age Lungs: clear to auscultation bilaterally Heart: regular rate  and rhythm, S1, S2 normal, no murmur, click, rub or gallop    Assessment:    Healthy male exam. See below      Plan:   PMHx includes HTN on Norvasc.  Previously had lab work done at [place] where they drew CMET, CBC, Lipid Panel, A1C per his report.  Will not repeat today for this reason.  MS - Followed by [facility].  Continue with current medication and follow up schedule.   On Gilenya, continue.    Preventative Care - UTD with immunizations and had influenza vaccine in October per his report.    See After Visit Summary for Counseling Recommendations

## 2014-12-09 ENCOUNTER — Ambulatory Visit (INDEPENDENT_AMBULATORY_CARE_PROVIDER_SITE_OTHER): Payer: BLUE CROSS/BLUE SHIELD | Admitting: Neurology

## 2014-12-09 ENCOUNTER — Encounter: Payer: Self-pay | Admitting: Neurology

## 2014-12-09 VITALS — BP 150/86 | HR 72 | Resp 14 | Ht 69.0 in | Wt 240.0 lb

## 2014-12-09 DIAGNOSIS — R350 Frequency of micturition: Secondary | ICD-10-CM | POA: Diagnosis not present

## 2014-12-09 DIAGNOSIS — Z79899 Other long term (current) drug therapy: Secondary | ICD-10-CM

## 2014-12-09 DIAGNOSIS — R3911 Hesitancy of micturition: Secondary | ICD-10-CM | POA: Diagnosis not present

## 2014-12-09 DIAGNOSIS — H469 Unspecified optic neuritis: Secondary | ICD-10-CM | POA: Diagnosis not present

## 2014-12-09 DIAGNOSIS — R200 Anesthesia of skin: Secondary | ICD-10-CM | POA: Diagnosis not present

## 2014-12-09 DIAGNOSIS — R270 Ataxia, unspecified: Secondary | ICD-10-CM

## 2014-12-09 DIAGNOSIS — G35 Multiple sclerosis: Secondary | ICD-10-CM | POA: Diagnosis not present

## 2014-12-09 MED ORDER — TAMSULOSIN HCL 0.4 MG PO CAPS: 0.4000 mg | ORAL_CAPSULE | Freq: Every day | ORAL | Status: DC

## 2014-12-09 NOTE — Progress Notes (Signed)
[facility]  PATIENT: Lucas Berry DOB: [birth year]     HISTORICAL  CHIEF COMPLAINT:  Chief Complaint  Patient presents with  . Multiple Sclerosis    Sts. he continues to tolerate Gilenya well.  Denies new or worsening sx.  Feels vision has returned to baseline--Visual acuity today is 20/50 bilat with glasses, but his glasses are about 3 yrs. old.  Denies double vision, denies eye pain/fim    HISTORY OF PRESENT ILLNESS:  Lucas Berry is a 29 year old man with MS diagnosed  in early 2016.   He has been on Gilenya x 8 months and tolerates it well.   He had left optic neuritis about 6 weeks after starting Gilenya but has had no other exacerbation      Gait/strength/sensation:   His initial exacerbation involved poor gait.   He notes balance is slightly off but he does not need support and has not fallen.     He denies any definite weakness.  He still has left hand numbness.   Npo numbness elsewhere and no dysesthetic pain  Vision:   He feels he is back to baseline after the ON in June. No reduced acuity or change in color vision.    No diplopia.  Bladder:    He notes some urinary frequency, especially at night with 3 x nocturia, and hesitancy but no incontinence  Fatigue/sleep:   He denies much fatigue and stays active.    He hasn't returned to working out in a gym yet.    He takes a walk with his girlfriend nightly.   He tries to do 10000 steps daily.     He is sleeping well most nights.  Mood/cognition:   He denies any problems with depression or anxiety.   He has no cognitive issues.      MS History:   In December 2015 he had a several month history of dizziness, ataxic gait and hand numbness. MRI early December 2015 showed multiple white matter foci including several in the posterior fossa (left middle cerebellar peduncle, pons, medulla) and periventricular foci in both hemispheres. He also had a spine focus at C2. There were no acute lesions (no  enhancement, no DWI brightness).Blood work showed normal or negative vasculitis labs, ACE, Lyme, HIV, RPR, pan ANCA, and anti-NMO antibody. Visual evoked potentials were abnormal showing prolongation of the P100 to about 130 ms on either side.  Lumbar puncture on 03/05/2014 showed abnormal CSF with > 5 oligoclonal bands and elevated IgG index of 0.78.    ROS:  Out of a complete 14 system review of symptoms, the patient complains only of the following symptoms, and all other reviewed systems are negative.  He notes difficulty with urination  He has some fatigue   ALLERGIES: No Known Allergies  HOME MEDICATIONS:  Current outpatient prescriptions:  .  amLODipine (NORVASC) 5 MG tablet, Take 5 mg by mouth daily., Disp: , Rfl: 0 .  Fingolimod HCl 0.5 MG CAPS, Take 0.5 mg by mouth daily., Disp: , Rfl:   PAST MEDICAL HISTORY: Past Medical History  Diagnosis Date  . Carpal tunnel syndrome   . Multiple sclerosis (HCC)   . Movement disorder   . Hypertension   . Vision abnormalities     PAST SURGICAL HISTORY: Past Surgical History  Procedure Laterality Date  . Mouth surgery  10 years ago    FAMILY HISTORY: Family History  Problem Relation Age of Onset  . High blood pressure Mother   . Cancer  Maternal Grandmother   . High blood pressure Father     SOCIAL HISTORY:  Social History   Social History  . Marital Status: Single    Spouse Name: N/A  . Number of Children: 2  . Years of Education: Assoc   Occupational History  .  Other    [employer]   Social History Main Topics  . Smoking status: Never Smoker   . Smokeless tobacco: Never Used  . Alcohol Use: 0.0 oz/week    0 Standard drinks or equivalent per week     Comment: weekly  . Drug Use: No  . Sexual Activity: Not on file   Other Topics Concern  . Not on file   Social History Narrative   Patient lives at home with his family.   Caffeine use: occasionally   Patient has a college education     Patient has 2 children    Patient is right handed      PHYSICAL EXAM  Filed Vitals:   12/09/14 1429  BP: 150/86  Pulse: 72  Resp: 14  Height:  (1.753 m)  Weight: 240 lb (108.863 kg)    Body mass index is 35.43 kg/(m^2).   General: The patient is well-developed and well-nourished and in no acute distress    Neurologic Exam  Mental status: The patient is alert and oriented x 3 at the time of the examination. The patient has apparent normal recent and remote memory, with an apparently normal attention span and concentration ability.   Speech is normal.  Cranial nerves: Extraocular movements are full. Pupils show mild left APD.  He notes mild color desaturation out of left eye. Facial symmetry is present. There is good facial sensation to soft touch bilaterally.Facial strength is normal.  Trapezius and sternocleidomastoid strength is normal. No dysarthria is noted.   Motor:  Muscle bulk is normal.   Tone is normal. Strength is  5 / 5 in all 4 extremities.   Coordination: Cerebellar testing reveals good finger-nose-finger and heel-to-shin  Sensory: He reports symmetric sensation to touch and temperature in the arms and legs.  DTRs:   Reflexes are normal and symmetric in the arms. Reflexes are brisk in the legs. There is no ankle clonus.  Gait and station: Station is normal.   Gait is normal. Tandem gait is mildly wide . Romberg is negative.      DIAGNOSTIC DATA (LABS, IMAGING, TESTING) - I reviewed patient records, labs, notes, testing and imaging myself where available.  Lab Results  Component Value Date   WBC 4.1 08/05/2014   HGB 14.8 12/22/2013   HCT 35.0* 08/05/2014   MCV 78* 12/22/2013   PLT 279 12/22/2013      Component Value Date/Time   NA 139 12/22/2013 0815   K 4.1 12/22/2013 0815   CL 103 12/22/2013 0815   CO2 23 12/22/2013 0815   GLUCOSE 104* 12/22/2013 0815   BUN 9 12/22/2013 0815   CREATININE 1.12 12/22/2013 0815   CALCIUM 9.3 12/22/2013 0815     PROT 6.6 08/05/2014 1457   ALBUMIN 4.7 08/05/2014 1457   AST 22 08/05/2014 1457   ALT 37 08/05/2014 1457   ALKPHOS 75 08/05/2014 1457   BILITOT 0.4 08/05/2014 1457   BILITOT 0.6 12/22/2013 0815   GFRNONAA 90 12/22/2013 0815   GFRAA 104 12/22/2013 0815   No results found for: CHOL, HDL, LDLCALC, LDLDIRECT, TRIG, CHOLHDL No results found for: ZOXW9U Lab Results  Component Value Date   VITAMINB12 469  12/22/2013   Lab Results  Component Value Date   TSH 2.300 12/22/2013       ASSESSMENT AND PLAN  Multiple sclerosis (HCC)  Optic neuritis  Ataxia  High risk medication use  Numbness  Urinary frequency  Urinary hesitancy   1.  Continue Gilenya. Check a CBC with differential and LFT to assess for severe lymphopenia or hepatotoxicity. 2.   Trial of Flomax for urinary hesitancy.    3.    MRI of the brain to assess for the possibility of subclinical progression. If present, consider a change in DMT 4.  He will return to see me in 6 months or sooner if there are new or worsening neurologic symptoms.      Richard A. Epimenio Foot, MD, PhD 12/09/2014, 2:42 PM Certified in Neurology, Clinical Neurophysiology, Sleep Medicine, Pain Medicine and Neuroimaging  [facility] [address] [place], [place] [address] [phone]

## 2014-12-10 ENCOUNTER — Telehealth: Payer: Self-pay | Admitting: *Deleted

## 2014-12-10 LAB — HEPATIC FUNCTION PANEL
ALT: 42 IU/L (ref 0–44)
AST: 25 IU/L (ref 0–40)
Albumin: 4.6 g/dL (ref 3.5–5.5)
Alkaline Phosphatase: 70 IU/L (ref 39–117)
Bilirubin Total: 0.5 mg/dL (ref 0.0–1.2)
Bilirubin, Direct: 0.13 mg/dL (ref 0.00–0.40)
TOTAL PROTEIN: 6.8 g/dL (ref 6.0–8.5)

## 2014-12-10 LAB — CBC WITH DIFFERENTIAL/PLATELET
Basophils Absolute: 0 10*3/uL (ref 0.0–0.2)
Basos: 0 %
EOS (ABSOLUTE): 0 10*3/uL (ref 0.0–0.4)
Eos: 0 %
HEMOGLOBIN: 12.4 g/dL — AB (ref 12.6–17.7)
Hematocrit: 39 % (ref 37.5–51.0)
IMMATURE GRANS (ABS): 0 10*3/uL (ref 0.0–0.1)
IMMATURE GRANULOCYTES: 0 %
Lymphocytes Absolute: 0.4 10*3/uL — ABNORMAL LOW (ref 0.7–3.1)
Lymphs: 14 %
MCH: 22.2 pg — ABNORMAL LOW (ref 26.6–33.0)
MCHC: 31.8 g/dL (ref 31.5–35.7)
MCV: 70 fL — AB (ref 79–97)
MONOCYTES: 12 %
Monocytes Absolute: 0.3 10*3/uL (ref 0.1–0.9)
NEUTROS PCT: 74 %
Neutrophils Absolute: 2.1 10*3/uL (ref 1.4–7.0)
PLATELETS: 253 10*3/uL (ref 150–379)
RBC: 5.59 x10E6/uL (ref 4.14–5.80)
RDW: 19.8 % — ABNORMAL HIGH (ref 12.3–15.4)
WBC: 2.8 10*3/uL — ABNORMAL LOW (ref 3.4–10.8)

## 2014-12-10 NOTE — Telephone Encounter (Signed)
-----   Message from Asa Lente, MD sent at 12/10/2014 12:14 PM EST ----- He has a mild anemia that might be iron deficiency or genetic. If this has not been worked up in the past, we can forward labwork to his primary care doctor.    Low lymphocyte count is expected with Gilenya

## 2014-12-10 NOTE — Telephone Encounter (Signed)
LMTC./fim 

## 2014-12-15 ENCOUNTER — Ambulatory Visit (INDEPENDENT_AMBULATORY_CARE_PROVIDER_SITE_OTHER): Payer: BLUE CROSS/BLUE SHIELD

## 2014-12-15 DIAGNOSIS — R350 Frequency of micturition: Secondary | ICD-10-CM | POA: Diagnosis not present

## 2014-12-15 DIAGNOSIS — G35 Multiple sclerosis: Secondary | ICD-10-CM | POA: Diagnosis not present

## 2014-12-15 MED ORDER — GADOPENTETATE DIMEGLUMINE 469.01 MG/ML IV SOLN: 20.0000 mL | Freq: Once | INTRAVENOUS | Status: DC | PRN

## 2014-12-15 NOTE — Telephone Encounter (Signed)
I have spoken with Lucas Berry this morning, while he was in the office for mri, and per RAS, advised that labs showed a mild anemia that may be genetic or due to fe def., and low lymphocytes, due to Gilenya; not low enough to cause concern at this time . He verbalized understanding of same, sts. he has been told of anemia before.  I have given him a copy of his labs to take with him today, for pcp/fim

## 2014-12-15 NOTE — Telephone Encounter (Signed)
-----   Message from Richard A Sater, MD sent at 12/10/2014 12:14 PM EST ----- He has a mild anemia that might be iron deficiency or genetic. If this has not been worked up in the past, we can forward labwork to his primary care doctor.    Low lymphocyte count is expected with Gilenya 

## 2014-12-16 ENCOUNTER — Telehealth: Payer: Self-pay | Admitting: *Deleted

## 2014-12-16 NOTE — Telephone Encounter (Signed)
-----   Message from Asa Lente, MD sent at 12/16/2014  8:43 AM EST ----- Please let him know that I compared his new MRI that was just done to the old one. There are no new lesions

## 2014-12-16 NOTE — Telephone Encounter (Signed)
Called pt. Lucas Berry for him to call w/ results. Gave [facility] phone number.

## 2014-12-16 NOTE — Telephone Encounter (Signed)
Spoke to pt about MRI results per Dr Epimenio Foot note. Pt verbalized understanding.

## 2015-02-08 ENCOUNTER — Other Ambulatory Visit: Payer: Self-pay | Admitting: Neurology

## 2015-04-26 ENCOUNTER — Telehealth: Payer: Self-pay | Admitting: Neurology

## 2015-04-26 NOTE — Telephone Encounter (Signed)
I have spoken with [facility] today and confirmed that they have Gilenya rx. and it has been processed--they will reach out to pt. to schedule delivery.  I have lmom for pt. that I have called [facility] to confirm rx.  There is no PA needed/fim

## 2015-04-26 NOTE — Telephone Encounter (Signed)
Pt is requesting a refill on GILENYA 0.5 MG CAPS. He also says there may be a PA required.

## 2015-06-09 ENCOUNTER — Ambulatory Visit (INDEPENDENT_AMBULATORY_CARE_PROVIDER_SITE_OTHER): Payer: BLUE CROSS/BLUE SHIELD | Admitting: Neurology

## 2015-06-09 ENCOUNTER — Encounter: Payer: Self-pay | Admitting: Neurology

## 2015-06-09 VITALS — BP 134/88 | HR 76 | Resp 12 | Ht 69.0 in | Wt 238.0 lb

## 2015-06-09 DIAGNOSIS — R270 Ataxia, unspecified: Secondary | ICD-10-CM | POA: Diagnosis not present

## 2015-06-09 DIAGNOSIS — R350 Frequency of micturition: Secondary | ICD-10-CM | POA: Diagnosis not present

## 2015-06-09 DIAGNOSIS — G35 Multiple sclerosis: Secondary | ICD-10-CM

## 2015-06-09 DIAGNOSIS — H469 Unspecified optic neuritis: Secondary | ICD-10-CM | POA: Diagnosis not present

## 2015-06-09 DIAGNOSIS — E559 Vitamin D deficiency, unspecified: Secondary | ICD-10-CM

## 2015-06-09 DIAGNOSIS — R200 Anesthesia of skin: Secondary | ICD-10-CM | POA: Diagnosis not present

## 2015-06-09 DIAGNOSIS — Z79899 Other long term (current) drug therapy: Secondary | ICD-10-CM

## 2015-06-09 NOTE — Progress Notes (Signed)
[facility]  PATIENT: Lucas Berry DOB: [birth year]     HISTORICAL  CHIEF COMPLAINT:  No chief complaint on file.   HISTORY OF PRESENT ILLNESS:  Lucas Berry is a 30 year old man with MS diagnosed  in early 2016.   He has been on Gilenya since March 2016 and tolerates it well.   He had left optic neuritis about 6 weeks after starting Gilenya but has had no other exacerbation    MRI 12/15/14 showed no new lesions.     He has felt his left arm tires out quickly.     Gait/strength/sensation:   His initial exacerbation involved poor gait.   He notes balance is slightly off but he does not need support and has not fallen.     He denies any definite weakness but the left arm fatigues very easy.   The left hand numbness is still present --- got better form initial exacerbation but never resolved.   No numbness elsewhere and no dysesthetic pain  Vision:   Vision is back to baseline after the ON in June. No reduced acuity or change in color vision.    No diplopia.  Bladder:    He notes some urinary frequency, especially at night with 3 x nocturia, and hesitancy but no incontinence.   Flomax did not help.   He feels hesitancy is better than last visit.     Fatigue/sleep:   He denies much fatigue and stays active.    He hasn't returned to working out in a gym yet.    He takes a walk with his girlfriend nightly and walks a lot at work (> 10000 steps daily, once [id number]).        He is sleeping well most nights.   If he has nocturia, he quickly falls back asleep.  Mood/cognition:   He denies any problems with depression or anxiety.   He has no cognitive issues.      MS History:   In December 2015 he had a several month history of dizziness, ataxic gait and hand numbness. MRI early December 2015 showed multiple white matter foci including several in the posterior fossa (left middle cerebellar peduncle, pons, medulla) and periventricular foci in both hemispheres. He also had a  spine focus at C2. There were no acute lesions (no enhancement, no DWI brightness).Blood work showed normal or negative vasculitis labs, ACE, Lyme, HIV, RPR, pan ANCA, and anti-NMO antibody. Visual evoked potentials were abnormal showing prolongation of the P100 to about 130 ms on either side.  Lumbar puncture on 03/05/2014 showed abnormal CSF with > 5 oligoclonal bands and elevated IgG index of 0.78.    ROS:  Out of a complete 14 system review of symptoms, the patient complains only of the following symptoms, and all other reviewed systems are negative.  He notes difficulty with urination  He has some fatigue   ALLERGIES: No Known Allergies  HOME MEDICATIONS:  Current outpatient prescriptions:  .  amLODipine (NORVASC) 5 MG tablet, Take 5 mg by mouth daily., Disp: , Rfl: 0 .  GILENYA 0.5 MG CAPS, TAKE 1 CAPSULE ONCE A DAY, Disp: 90 capsule, Rfl: 2 .  tamsulosin (FLOMAX) 0.4 MG CAPS capsule, Take 1 capsule (0.4 mg total) by mouth daily., Disp: 30 capsule, Rfl: 11 No current facility-administered medications for this visit.  Facility-Administered Medications Ordered in Other Visits:  .  gadopentetate dimeglumine (MAGNEVIST) injection 20 mL, 20 mL, Intravenous, Once PRN, Asa Lente, MD  PAST MEDICAL HISTORY:  Past Medical History  Diagnosis Date  . Carpal tunnel syndrome   . Multiple sclerosis (HCC)   . Movement disorder   . Hypertension   . Vision abnormalities     PAST SURGICAL HISTORY: Past Surgical History  Procedure Laterality Date  . Mouth surgery  10 years ago    FAMILY HISTORY: Family History  Problem Relation Age of Onset  . High blood pressure Mother   . Cancer Maternal Grandmother   . High blood pressure Father     SOCIAL HISTORY:  Social History   Social History  . Marital Status: Single    Spouse Name: N/A  . Number of Children: 2  . Years of Education: Assoc   Occupational History  .  Other    [employer]   Social History Main  Topics  . Smoking status: Never Smoker   . Smokeless tobacco: Never Used  . Alcohol Use: 0.0 oz/week    0 Standard drinks or equivalent per week     Comment: weekly  . Drug Use: No  . Sexual Activity: Not on file   Other Topics Concern  . Not on file   Social History Narrative   Patient lives at home with his family.   Caffeine use: occasionally   Patient has a college education    Patient has 2 children    Patient is right handed      PHYSICAL EXAM  There were no vitals filed for this visit.  There is no weight on file to calculate BMI.   General: The patient is well-developed and well-nourished and in no acute distress    Neurologic Exam  Mental status: The patient is alert and oriented x 3 at the time of the examination. The patient has apparent normal recent and remote memory, with an apparently normal attention span and concentration ability.   Speech is normal.  Cranial nerves: Extraocular movements are full. Pupils show mild left APD.  He notes mild color desaturation out of left eye. Facial symmetry is present. There is good facial sensation to soft touch bilaterally.Facial strength is normal.  Trapezius and sternocleidomastoid strength is normal. No dysarthria is noted.   Motor:  Muscle bulk is normal.   Tone is normal. Strength is  5 / 5 in all 4 extremities.   Coordination: Cerebellar testing reveals good finger-nose-finger and heel-to-shin  Sensory: He reports symmetric sensation to touch and temperature in the arms and legs.  DTRs:   Reflexes are normal and symmetric in the arms. Reflexes are brisk in the legs. There is no ankle clonus.  Gait and station: Station is normal.   Gait is normal. Tandem gait is mildly wide . Romberg is negative.      DIAGNOSTIC DATA (LABS, IMAGING, TESTING) - I reviewed patient records, labs, notes, testing and imaging myself where available.  Lab Results  Component Value Date   WBC 2.8* 12/09/2014   HGB 14.8 12/22/2013     HCT 39.0 12/09/2014   MCV 70* 12/09/2014   PLT 253 12/09/2014      Component Value Date/Time   NA 139 12/22/2013 0815   K 4.1 12/22/2013 0815   CL 103 12/22/2013 0815   CO2 23 12/22/2013 0815   GLUCOSE 104* 12/22/2013 0815   BUN 9 12/22/2013 0815   CREATININE 1.12 12/22/2013 0815   CALCIUM 9.3 12/22/2013 0815   PROT 6.8 12/09/2014 1503   ALBUMIN 4.6 12/09/2014 1503   AST 25 12/09/2014 1503   ALT 42 12/09/2014  1503   ALKPHOS 70 12/09/2014 1503   BILITOT 0.5 12/09/2014 1503   BILITOT 0.6 12/22/2013 0815   GFRNONAA 90 12/22/2013 0815   GFRAA 104 12/22/2013 0815   No results found for: CHOL, HDL, LDLCALC, LDLDIRECT, TRIG, CHOLHDL No results found for: ZOXW9U Lab Results  Component Value Date   VITAMINB12 469 12/22/2013   Lab Results  Component Value Date   TSH 2.300 12/22/2013       ASSESSMENT AND PLAN  Multiple sclerosis (HCC) - Plan: Hepatic function panel, CBC With Differential, Hepatic function panel, CBC With Differential, Vitamin D, 25-hydroxy, Vitamin D, 25-hydroxy  High risk medication use - Plan: Hepatic function panel, CBC With Differential, Hepatic function panel, CBC With Differential, Vitamin D, 25-hydroxy, Vitamin D, 25-hydroxy  Mild vitamin D deficiency - Plan: Vitamin D, 25-hydroxy, Vitamin D, 25-hydroxy  Ataxia  Numbness  Urinary frequency  Optic neuritis   1.  Continue Gilenya. Check a CBC with differential and LFT to assess for severe lymphopenia or hepatotoxicity. 2.  He is thinking about having her second child. We discussed that he may need to go off on Gilenya onto another medication during that time. 3.  He will return to see me in 6 months or sooner if there are new or worsening neurologic symptoms.      Vicenta Olds A. Epimenio Foot, MD, PhD 06/09/2015, 5:01 PM Certified in Neurology, Clinical Neurophysiology, Sleep Medicine, Pain Medicine and Neuroimaging  [facility] [address], [address] [place], [place] [address] [phone]

## 2015-06-10 LAB — HEPATIC FUNCTION PANEL
ALBUMIN: 4.6 g/dL (ref 3.5–5.5)
ALK PHOS: 70 IU/L (ref 39–117)
ALT: 45 IU/L — ABNORMAL HIGH (ref 0–44)
AST: 28 IU/L (ref 0–40)
BILIRUBIN, DIRECT: 0.14 mg/dL (ref 0.00–0.40)
Bilirubin Total: 0.6 mg/dL (ref 0.0–1.2)
Total Protein: 7 g/dL (ref 6.0–8.5)

## 2015-06-10 LAB — CBC WITH DIFFERENTIAL
Basophils Absolute: 0 10*3/uL (ref 0.0–0.2)
Basos: 0 %
EOS (ABSOLUTE): 0 10*3/uL (ref 0.0–0.4)
Eos: 0 %
Hematocrit: 43.7 % (ref 37.5–51.0)
Hemoglobin: 14.1 g/dL (ref 12.6–17.7)
IMMATURE GRANS (ABS): 0 10*3/uL (ref 0.0–0.1)
Immature Granulocytes: 0 %
LYMPHS: 15 %
Lymphocytes Absolute: 0.4 10*3/uL — ABNORMAL LOW (ref 0.7–3.1)
MCH: 25.9 pg — AB (ref 26.6–33.0)
MCHC: 32.3 g/dL (ref 31.5–35.7)
MCV: 80 fL (ref 79–97)
Monocytes Absolute: 0.4 10*3/uL (ref 0.1–0.9)
Monocytes: 12 %
NEUTROS ABS: 2.1 10*3/uL (ref 1.4–7.0)
Neutrophils: 73 %
RBC: 5.44 x10E6/uL (ref 4.14–5.80)
RDW: 15.5 % — AB (ref 12.3–15.4)
WBC: 2.9 10*3/uL — ABNORMAL LOW (ref 3.4–10.8)

## 2015-06-10 LAB — VITAMIN D 25 HYDROXY (VIT D DEFICIENCY, FRACTURES): Vit D, 25-Hydroxy: 17.5 ng/mL — ABNORMAL LOW (ref 30.0–100.0)

## 2015-06-13 ENCOUNTER — Telehealth: Payer: Self-pay | Admitting: *Deleted

## 2015-06-13 NOTE — Telephone Encounter (Signed)
LMTC./fim 

## 2015-06-13 NOTE — Telephone Encounter (Signed)
-----   Message from Asa Lente, MD sent at 06/10/2015 10:53 AM EDT ----- The vitamin D was very low so I want him to take 50,000 units a 12 weeks. Then he should start over-the-counter 5000 units daily.  The white blood cell count was down but we see that often with Gilenya.

## 2015-06-14 ENCOUNTER — Telehealth: Payer: Self-pay | Admitting: *Deleted

## 2015-06-14 MED ORDER — VITAMIN D (ERGOCALCIFEROL) 1.25 MG (50000 UNIT) PO CAPS: 50000.0000 [IU] | ORAL_CAPSULE | ORAL | Status: DC

## 2015-06-14 NOTE — Telephone Encounter (Signed)
-----   Message from Richard A Sater, MD sent at 06/10/2015 10:53 AM EDT ----- The vitamin D was very low so I want him to take 50,000 units a 12 weeks. Then he should start over-the-counter 5000 units daily.  The white blood cell count was down but we see that often with Gilenya. 

## 2015-06-14 NOTE — Telephone Encounter (Signed)
I have spoken with Llewellyn this afternoon, and per RAS, advised that vit. d level is very low--he needs to take rx. vit. d 50,000iu weekly for 12 wks, then 5,000iu daily after that.  WBC's are some low, as often seen with Gilenya use.  He verbalized understanding of same.  Rx. escribed to [facility] per his request/fim

## 2015-06-14 NOTE — Telephone Encounter (Signed)
Patient returned Faith's call. Please call [phone].

## 2015-06-28 ENCOUNTER — Encounter: Payer: Self-pay | Admitting: Neurology

## 2015-06-28 ENCOUNTER — Ambulatory Visit (INDEPENDENT_AMBULATORY_CARE_PROVIDER_SITE_OTHER): Payer: BLUE CROSS/BLUE SHIELD | Admitting: Neurology

## 2015-06-28 VITALS — BP 136/92 | HR 64 | Resp 12 | Ht 69.0 in | Wt 237.5 lb

## 2015-06-28 DIAGNOSIS — R350 Frequency of micturition: Secondary | ICD-10-CM

## 2015-06-28 DIAGNOSIS — G35 Multiple sclerosis: Secondary | ICD-10-CM

## 2015-06-28 DIAGNOSIS — R200 Anesthesia of skin: Secondary | ICD-10-CM

## 2015-06-28 DIAGNOSIS — H469 Unspecified optic neuritis: Secondary | ICD-10-CM

## 2015-06-28 NOTE — Progress Notes (Addendum)
[facility]  PATIENT: Lucas Berry DOB: February 24, 1985     HISTORICAL  CHIEF COMPLAINT:  Chief Complaint  Patient presents with  . Multiple Sclerosis    Is currently on Gilenya but he and sig. other are considering having a 2nd child, so would like to discuss other tx. options./fim    HISTORY OF PRESENT ILLNESS:  Lucas Berry is a 30 year old man with MS diagnosed  in early 2016.   He has been on Gilenya since March 2016 and tolerates it well.   He had left optic neuritis about 6 weeks after starting Gilenya but has had no other exacerbation    MRI 12/15/14 showed no new lesions.     He has felt his left arm tires out quickly.     Gait/strength/sensation:   His initial exacerbation involved poor gait.   He still notes balance is slightly off but he does not need support and has not fallen.  His left knee sometimes gives out.   He denies any definite weakness but the left arm fatigues very easy.   He also still has left hand > arm numbness.  No numbness elsewhere and no dysesthetic pain  Vision:   Vision is back to baseline after the ON in June. No reduced acuity or change in color vision.    No diplopia.  Bladder:    He notes some urinary frequency and nocturia, especially if he has a beer at night.   He reported some hesitancy but no incontinence.   Flomax did not help.   He feels hesitancy is better than last year.     Fatigue/sleep:   He denies much fatigue and stays active.    He hasn't gone back to teh gym but does walk sevral miles a day.     He is sleeping well most nights.   If he has nocturia, he quickly falls back asleep.  Mood/cognition:   He denies any problems with depression or anxiety.   He has no cognitive issues.     MS History:   In December 2015 he had a several month history of dizziness, ataxic gait and hand numbness. MRI early December 2015 showed multiple white matter foci including several in the posterior fossa (left middle cerebellar  peduncle, pons, medulla) and periventricular foci in both hemispheres. He also had a spine focus at C2. There were no acute lesions (no enhancement, no DWI brightness).Blood work showed normal or negative vasculitis labs, ACE, Lyme, HIV, RPR, pan ANCA, and anti-NMO antibody. Visual evoked potentials were abnormal showing prolongation of the P100 to about 130 ms on either side.  Lumbar puncture on 03/05/2014 showed abnormal CSF with > 5 oligoclonal bands and elevated IgG index of 0.78.MRI of the brain 12/15/2014 was unchanged.    ROS:  Out of a complete 14 system review of symptoms, the patient complains only of the following symptoms, and all other reviewed systems are negative.  He notes difficulty with urinary frequency and mild hesitancy.    He has fatigue   ALLERGIES: No Known Allergies  HOME MEDICATIONS:  Current outpatient prescriptions:  .  amLODipine (NORVASC) 5 MG tablet, Take 5 mg by mouth daily., Disp: , Rfl: 0 .  IFEREX 150 150 MG capsule, Take 150 mg by mouth daily., Disp: , Rfl: 0 .  Vitamin D, Ergocalciferol, (DRISDOL) 50000 units CAPS capsule, Take 1 capsule (50,000 Units total) by mouth every 7 (seven) days., Disp: 12 capsule, Rfl: 0 .  GILENYA 0.5 MG CAPS,  TAKE 1 CAPSULE ONCE A DAY, Disp: 90 capsule, Rfl: 2 No current facility-administered medications for this visit.  Facility-Administered Medications Ordered in Other Visits:  .  gadopentetate dimeglumine (MAGNEVIST) injection 20 mL, 20 mL, Intravenous, Once PRN, Asa Lente, MD  PAST MEDICAL HISTORY: Past Medical History  Diagnosis Date  . Carpal tunnel syndrome   . Multiple sclerosis (HCC)   . Movement disorder   . Hypertension   . Vision abnormalities     PAST SURGICAL HISTORY: Past Surgical History  Procedure Laterality Date  . Mouth surgery  10 years ago    FAMILY HISTORY: Family History  Problem Relation Age of Onset  . High blood pressure Mother   . Cancer Maternal Grandmother   .  High blood pressure Father     SOCIAL HISTORY:  Social History   Social History  . Marital Status: Single    Spouse Name: N/A  . Number of Children: 2  . Years of Education: Assoc   Occupational History  .  Other    [employer]   Social History Main Topics  . Smoking status: Never Smoker   . Smokeless tobacco: Never Used  . Alcohol Use: 0.0 oz/week    0 Standard drinks or equivalent per week     Comment: weekly  . Drug Use: No  . Sexual Activity: Not on file   Other Topics Concern  . Not on file   Social History Narrative   Patient lives at home with his family.   Caffeine use: occasionally   Patient has a college education    Patient has 2 children    Patient is right handed      PHYSICAL EXAM  Filed Vitals:   06/28/15 1637  BP: 136/92  Pulse: 64  Resp: 12  Height: 5\' 9"  (1.753 m)  Weight: 237 lb 8 oz (107.729 kg)    Body mass index is 35.06 kg/(m^2).   General: The patient is well-developed and well-nourished and in no acute distress    Neurologic Exam  Mental status: The patient is alert and oriented x 3 at the time of the examination. The patient has apparent normal recent and remote memory, with an apparently normal attention span and concentration ability.   Speech is normal.  Cranial nerves: Extraocular movements are full. Pupils show mild left APD.  He notes mild color desaturation out of left eye. Facial symmetry is present. There is good facial sensation to soft touch bilaterally.Facial strength is normal.  Trapezius and sternocleidomastoid strength is normal. No dysarthria is noted.   Motor:  Muscle bulk is normal.   Tone is normal. Strength is  5 / 5 in all 4 extremities.   Coordination: Cerebellar testing reveals good finger-nose-finger and heel-to-shin  Sensory: He reports symmetric sensation to touch and temperature in the arms and legs.  DTRs:   Reflexes are normal and symmetric in the arms. Reflexes are brisk in the legs. There  is no ankle clonus.  Gait and station: Station is normal.   Gait is normal. Tandem gait is mildly wide . Romberg is negative.      DIAGNOSTIC DATA (LABS, IMAGING, TESTING) - I reviewed patient records, labs, notes, testing and imaging myself where available.  Lab Results  Component Value Date   WBC 2.9* 06/09/2015   HGB 14.8 12/22/2013   HCT 43.7 06/09/2015   MCV 80 06/09/2015   PLT 253 12/09/2014      Component Value Date/Time   NA 139 12/22/2013  0815   K 4.1 12/22/2013 0815   CL 103 12/22/2013 0815   CO2 23 12/22/2013 0815   GLUCOSE 104* 12/22/2013 0815   BUN 9 12/22/2013 0815   CREATININE 1.12 12/22/2013 0815   CALCIUM 9.3 12/22/2013 0815   PROT 7.0 06/09/2015 1639   ALBUMIN 4.6 06/09/2015 1639   AST 28 06/09/2015 1639   ALT 45* 06/09/2015 1639   ALKPHOS 70 06/09/2015 1639   BILITOT 0.6 06/09/2015 1639   BILITOT 0.6 12/22/2013 0815   GFRNONAA 90 12/22/2013 0815   GFRAA 104 12/22/2013 0815   No results found for: CHOL, HDL, LDLCALC, LDLDIRECT, TRIG, CHOLHDL No results found for: GEXB2W Lab Results  Component Value Date   VITAMINB12 469 12/22/2013   Lab Results  Component Value Date   TSH 2.300 12/22/2013       ASSESSMENT AND PLAN  Multiple sclerosis (HCC)  Optic neuritis  Numbness  Urinary frequency   1.  Continue Gilenya for now.    However, we had a long conversation about other options for therapy. He and his girlfriend are planning on having a second child soon and Gilenya is generally not recommended, even for males, when a couple is trying to conceive. We discussed Tysabri and ocrelizumab as to other options. He has an aggressive MS and I do not think it would be appropriate for him to go on a of the interferons, Copaxone or Tecfidera.   We also discussed Julaine Hua though it is generally reserve for people who have tried at least 2 other medications.   Medications, I would want him to washout from Gilenya for about 6 weeks 2.   He will return to  have blood work to rule out chronic infection such as hepatitis and TB. 3.  Continue Vit D 4.   He will return to see me in 6 months or sooner if there are new or worsening neurologic symptoms.      Aqeel Norgaard A. Epimenio Foot, MD, PhD 06/28/2015, 4:43 PM Certified in Neurology, Clinical Neurophysiology, Sleep Medicine, Pain Medicine and Neuroimaging  [facility] [address] [place], [place] [address] [phone]   ADDENDUM:   Mr. Alessandra Bevels has an aggressive form of relapsing remitting multiple sclerosis and is a good candidate for ocrelizumab therapy. Lab results for hepatitis B and tuberculosis were nonreactive or negative.  Ruie Sendejo A. Epimenio Foot, MD, PhD Certified in Neurology, Clinical Neurophysiology, Sleep Medicine, Pain Medicine and Neuroimaging

## 2015-07-13 ENCOUNTER — Other Ambulatory Visit: Payer: Self-pay | Admitting: *Deleted

## 2015-07-13 ENCOUNTER — Other Ambulatory Visit (INDEPENDENT_AMBULATORY_CARE_PROVIDER_SITE_OTHER): Payer: Self-pay

## 2015-07-13 DIAGNOSIS — G35 Multiple sclerosis: Secondary | ICD-10-CM

## 2015-07-13 DIAGNOSIS — Z79899 Other long term (current) drug therapy: Secondary | ICD-10-CM

## 2015-07-13 DIAGNOSIS — Z0289 Encounter for other administrative examinations: Secondary | ICD-10-CM

## 2015-07-14 LAB — CBC WITH DIFFERENTIAL/PLATELET
BASOS: 0 %
Basophils Absolute: 0 10*3/uL (ref 0.0–0.2)
EOS (ABSOLUTE): 0 10*3/uL (ref 0.0–0.4)
EOS: 1 %
HEMATOCRIT: 44.5 % (ref 37.5–51.0)
Hemoglobin: 14.2 g/dL (ref 12.6–17.7)
IMMATURE GRANULOCYTES: 0 %
Immature Grans (Abs): 0 10*3/uL (ref 0.0–0.1)
LYMPHS ABS: 0.3 10*3/uL — AB (ref 0.7–3.1)
Lymphs: 11 %
MCH: 25.8 pg — ABNORMAL LOW (ref 26.6–33.0)
MCHC: 31.9 g/dL (ref 31.5–35.7)
MCV: 81 fL (ref 79–97)
MONOS ABS: 0.3 10*3/uL (ref 0.1–0.9)
Monocytes: 12 %
NEUTROS PCT: 76 %
Neutrophils Absolute: 2 10*3/uL (ref 1.4–7.0)
PLATELETS: 227 10*3/uL (ref 150–379)
RBC: 5.51 x10E6/uL (ref 4.14–5.80)
RDW: 15 % (ref 12.3–15.4)
WBC: 2.6 10*3/uL — AB (ref 3.4–10.8)

## 2015-07-14 LAB — COMPREHENSIVE METABOLIC PANEL
A/G RATIO: 1.7 (ref 1.2–2.2)
ALK PHOS: 69 IU/L (ref 39–117)
ALT: 44 IU/L (ref 0–44)
AST: 21 IU/L (ref 0–40)
Albumin: 4.3 g/dL (ref 3.5–5.5)
BILIRUBIN TOTAL: 0.5 mg/dL (ref 0.0–1.2)
BUN/Creatinine Ratio: 11 (ref 9–20)
BUN: 12 mg/dL (ref 6–20)
CHLORIDE: 100 mmol/L (ref 96–106)
CO2: 22 mmol/L (ref 18–29)
Calcium: 9.4 mg/dL (ref 8.7–10.2)
Creatinine, Ser: 1.06 mg/dL (ref 0.76–1.27)
GFR calc Af Amer: 110 mL/min/{1.73_m2} (ref >59)
GFR calc non Af Amer: 95 mL/min/{1.73_m2} (ref >59)
GLOBULIN, TOTAL: 2.6 g/dL (ref 1.5–4.5)
Glucose: 100 mg/dL — ABNORMAL HIGH (ref 65–99)
POTASSIUM: 4.4 mmol/L (ref 3.5–5.2)
SODIUM: 141 mmol/L (ref 134–144)
Total Protein: 6.9 g/dL (ref 6.0–8.5)

## 2015-07-14 LAB — HEPATITIS B SURFACE ANTIBODY,QUALITATIVE: Hep B Surface Ab, Qual: NONREACTIVE

## 2015-07-14 LAB — HEPATITIS B CORE ANTIBODY, TOTAL: HEP B C TOTAL AB: NEGATIVE

## 2015-07-14 LAB — HEPATITIS B SURFACE ANTIGEN: Hepatitis B Surface Ag: NEGATIVE

## 2015-07-16 LAB — QUANTIFERON IN TUBE
QFT TB AG MINUS NIL VALUE: 0 [IU]/mL
QUANTIFERON MITOGEN VALUE: 1.16 [IU]/mL
QUANTIFERON TB AG VALUE: 0.02 [IU]/mL
QUANTIFERON TB GOLD: NEGATIVE
Quantiferon Nil Value: 0.02 IU/mL

## 2015-07-16 LAB — QUANTIFERON TB GOLD ASSAY (BLOOD)

## 2015-07-19 ENCOUNTER — Telehealth: Payer: Self-pay | Admitting: *Deleted

## 2015-07-19 NOTE — Telephone Encounter (Signed)
-----   Message from Richard A Sater, MD sent at 07/19/2015  1:10 PM EDT ----- Labs are fine and we can send in the ocrelizumab form. I will want the infusion to be 6-8 weeks after he stopped Gilenya. 

## 2015-07-19 NOTE — Telephone Encounter (Signed)
-----   Message from Asa Lente, MD sent at 07/19/2015  1:10 PM EDT ----- Labs are fine and we can send in the ocrelizumab form. I will want the infusion to be 6-8 weeks after he stopped Gilenya.

## 2015-07-19 NOTE — Telephone Encounter (Signed)
I have spoken with Lucas Berry this afternoon, and per RAS, advised that labs are ok for Ocrelizumab.  He verbalized understanding of same, sts. he does not want to switch meds unless and until he and gf decide to try and have another baby.  He will need to sign an Ocrelizumab srf if he decides to switch/fim

## 2015-07-19 NOTE — Telephone Encounter (Signed)
Patient is calling back and would like a return call please.

## 2015-07-19 NOTE — Telephone Encounter (Signed)
I have spoken with Lucas Berry and per RAS, advised labs are ok for Ocrelizumab, if he would still like to switch. Lucas Berry sts. he continues to tolerate Gilenya well, but he and his girlfriend are considering having another child--this is why he was interested in switching to [place].  He would like to stay on Gilenya until they are ready to try and conceive. I have reiterated that he should not even try to conceive while on Gilenya.  He verbalized understanding of same, sts. he will call when he is ready to switch.  He has not signed an Ocrelizumab srf--will need to sign one if he decides to change meds/fim

## 2015-08-16 ENCOUNTER — Encounter: Payer: Self-pay | Admitting: *Deleted

## 2015-08-26 ENCOUNTER — Encounter: Payer: Self-pay | Admitting: *Deleted

## 2015-08-26 ENCOUNTER — Telehealth: Payer: Self-pay | Admitting: Neurology

## 2015-08-26 NOTE — Telephone Encounter (Signed)
I have spoken with Lucas Berry this morning.  He sts.  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  This will involve a combination of computer, some physical work (lifting).  He sts. he is able to complete both the cognitive and physical requirements of this job.  He requests a letter stating it would be safe for him to take this job.  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  Letter provided--up front [employer] for him to pick up/fim

## 2015-08-26 NOTE — Telephone Encounter (Signed)
Pt called stating that he has a job offer and wants to know if he would be ok to do so. 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 Please call and advise

## 2015-10-12 ENCOUNTER — Telehealth: Payer: Self-pay | Admitting: Neurology

## 2015-10-12 NOTE — Telephone Encounter (Signed)
Email received but I am not able to open the attachment.  I have spoken with Lucas Berry.  He will either drop a copy by our office or text me a copy of the email/fim

## 2015-10-12 NOTE — Telephone Encounter (Signed)
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 Please call [phone].

## 2015-10-12 NOTE — Telephone Encounter (Signed)
I have spoken with Barkon this afternoon.  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

## 2015-10-14 NOTE — Telephone Encounter (Signed)
Lucas BeeKevin brought a request for med. rec. from his employer [employer] by the office.  He has applied for a promotion, as a Higher education careers advisertrain conductor.  [employer] is requesting office notes, mri/lab reports, current med list, to document that Lucas BeeKevin could safely perform this job.  Lucas BeeKevin requested I send all info to [employer] [facility].  I have faxed all info this morning, to his case coordinator, Lucas ShiversSally Berry, fax# [phone]./fim

## 2015-10-19 NOTE — Telephone Encounter (Signed)
LMOM for Lucas Berry to let him know that I have spoken with Lucas Berry, and advised that he was seen 3 mos. ago, that the numbness he has in his left arm is a complaint that RAS has evaluated in the past, and documented, that there were no deficits noted in the left hand/arm that would  prevent him from doing his job.  If they just want him to come in early for his reg. sched. ov, that's fine--we are happy to give him an appt., but if they just needed confirmation that he does not have physical deficits in the left arm that will prevent him from doing his job., hopefully this call will suffice./fim

## 2015-10-19 NOTE — Telephone Encounter (Signed)
LMTC.  Unfortunately, insurance will not allow an infusion and office visit on the same day.  I will work with his schedule as best I can to give an appt. that is convenient for him, as he is working full time/fim

## 2015-10-19 NOTE — Telephone Encounter (Signed)
I have spoken with Lucas Berry this afternoon.  He sts. [employer] needs more explanation regarding fatigue he feels in his left arm, and if this will prevent him from doing his job.  He will forward me [name]'s email./fim

## 2015-10-19 NOTE — Telephone Encounter (Signed)
Pt called said his new employer is needing updated neurology eval reg MS. He said he has an appt 10/25 with Inetta Fermo.He is wanting to know if he could get eval appt the same day. Please call @ [phone]

## 2015-10-19 NOTE — Telephone Encounter (Signed)
LMTC./fim 

## 2015-10-21 NOTE — Telephone Encounter (Signed)
Pt called said he has been advised by employer he will need to be eval by provider. He is wanting to get in prior to his appt on 11/2. Please call. Pt is aware RN is out of the office and will return on Monday and this can wait until then.

## 2015-10-24 NOTE — Telephone Encounter (Signed)
LMTC./fim 

## 2015-11-01 NOTE — Telephone Encounter (Signed)
No cancellations so far to get pt. in earlier--will leave 11-03-15 appt/fim

## 2015-11-03 ENCOUNTER — Encounter: Payer: Self-pay | Admitting: Neurology

## 2015-11-03 ENCOUNTER — Ambulatory Visit (INDEPENDENT_AMBULATORY_CARE_PROVIDER_SITE_OTHER): Payer: BLUE CROSS/BLUE SHIELD | Admitting: Neurology

## 2015-11-03 VITALS — BP 138/80 | HR 78 | Resp 14 | Ht 69.0 in | Wt 235.8 lb

## 2015-11-03 DIAGNOSIS — R3911 Hesitancy of micturition: Secondary | ICD-10-CM | POA: Diagnosis not present

## 2015-11-03 DIAGNOSIS — G35 Multiple sclerosis: Secondary | ICD-10-CM

## 2015-11-03 DIAGNOSIS — H469 Unspecified optic neuritis: Secondary | ICD-10-CM | POA: Diagnosis not present

## 2015-11-03 DIAGNOSIS — R200 Anesthesia of skin: Secondary | ICD-10-CM | POA: Diagnosis not present

## 2015-11-03 DIAGNOSIS — Z79899 Other long term (current) drug therapy: Secondary | ICD-10-CM

## 2015-11-03 NOTE — Progress Notes (Signed)
[facility]  PATIENT: Lucas Berry DOB: 10/06/1985     HISTORICAL  CHIEF COMPLAINT:  Chief Complaint  Patient presents with  . Multiple Sclerosis    He had part A of first Ocrelizumab infusion on 10-26-15, and is scheduled for Part B next week.  Denies new or worsening sx.  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   He has been on Gilenya since March 2016 and tolerated it well.  However, He is thinking about starting a family and Gilenya has a possible risk of increased birth defects (even when the male is taking). Therefore, we switched from Gilenya to ocrelizumab. He had the first half of the first dose last week and will come in next week for the second half of that dose and then get ocrelizumab every 6 months.   His multiple sclerosis has been very stable.  He has no exacerbations.   MRI 12/15/14 showed no new lesions.     Gait/strength/sensation:   Gait is slightly off since the first exacerbation though he did improve a lot, close to basline.   He has no falls and can climb a ladder multiple times..   Sometimes the left knee seems to give out but no definite weakness.  The left arm fatigues at times. He has mild left hand > arm numbness.  He has no numbness elsewhere and no dysesthetic pain.      Vision:   Vision is back to baseline after the ON in June. No reduced acuity or change in color vision.    No diplopia.  Bladder:    He feels the urinary frequency is better and has nocturia x 2.    He reported some hesitancy but no incontinence.   Flomax did not help.   He feels hesitancy is better than last year.     Fatigue/sleep:   He denies much fatigue and stays active.    He hasn't gone back to working  out in a gym but does walk several miles a day at work.     He is sleeping well most nights.   If he has nocturia, he quickly falls back asleep.  Mood/cognition:   He denies any problems with depression or anxiety.   He has no cognitive issues.     MS History:   In December 2015 he had a several month history of dizziness, ataxic gait and hand numbness. MRI early December 2015 showed multiple white matter foci including several in the posterior fossa (left middle cerebellar peduncle, pons, medulla) and periventricular foci in both hemispheres. He also had a spine focus at C2. There were no acute lesions (no enhancement, no DWI brightness).Blood work showed normal or negative vasculitis labs, ACE, Lyme, HIV, RPR, pan ANCA, and anti-NMO antibody. Visual evoked potentials were abnormal showing prolongation of the P100 to about 130 ms on either side.  Lumbar puncture on 03/05/2014 showed abnormal CSF with > 5 oligoclonal bands and elevated IgG index of 0.78.MRI of the brain 12/15/2014 was unchanged.    ROS:  Out of a complete 14 system review of symptoms, the patient complains only of the following symptoms, and all other reviewed systems are negative.  He notes difficulty  with urinary frequency and mild hesitancy.    He has fatigue   ALLERGIES: No Known Allergies  HOME MEDICATIONS:  Current Outpatient Prescriptions:  .  amLODipine (NORVASC) 5 MG tablet, Take 5 mg by mouth daily., Disp: , Rfl: 0 .  IFEREX 150 150 MG capsule, Take 150 mg by mouth daily., Disp: , Rfl: 0 .  ocrelizumab 600 mg in sodium chloride 0.9 % 500 mL, Inject 600 mg into the vein every 6 (six) months., Disp: , Rfl:  .  Vitamin D, Ergocalciferol, (DRISDOL) 50000 units CAPS capsule, Take 1 capsule (50,000 Units total) by mouth every 7 (seven) days., Disp: 12 capsule, Rfl: 0  PAST MEDICAL HISTORY: Past Medical History:  Diagnosis Date  . Carpal tunnel syndrome   . Hypertension   . Movement disorder   . Multiple  sclerosis (HCC)   . Vision abnormalities     PAST SURGICAL HISTORY: Past Surgical History:  Procedure Laterality Date  . MOUTH SURGERY  10 years ago    FAMILY HISTORY: Family History  Problem Relation Age of Onset  . High blood pressure Mother   . Cancer Maternal Grandmother   . High blood pressure Father     SOCIAL HISTORY:  Social History   Social History  . Marital status: Single    Spouse name: N/A  . Number of children: 2  . Years of education: Assoc   Occupational History  .  Other    [employer]   Social History Main Topics  . Smoking status: Never Smoker  . Smokeless tobacco: Never Used  . Alcohol use 0.0 oz/week     Comment: weekly  . Drug use: No  . Sexual activity: Not on file   Other Topics Concern  . Not on file   Social History Narrative   Patient lives at home with his family.   Caffeine use: occasionally   Patient has a college education    Patient has 2 children    Patient is right handed      PHYSICAL EXAM  Vitals:   11/03/15 1613  BP: 138/80  Pulse: 78  Resp: 14  Weight: 235 lb 12.8 oz (107 kg)  Height: 5\' 9"  (1.753 m)    Body mass index is 34.82 kg/m.   General: The patient is well-developed and well-nourished and in no acute distress    Neurologic Exam  Mental status: The patient is alert and oriented x 3 at the time of the examination. The patient has apparent normal recent and remote memory, with an apparently normal attention span and concentration ability.   Speech is normal.  Cranial nerves: Extraocular movements are full. Pupils show mild left APD.  Color vision is symmetric.. Facial symmetry is present. There is good facial sensation to soft touch bilaterally.Facial strength is normal.  Trapezius and sternocleidomastoid strength is normal. No dysarthria is noted.   Motor:  Muscle bulk is normal.   Tone is normal. Strength is  5 / 5 in all 4 extremities.   Coordination: Cerebellar testing reveals good  finger-nose-finger and heel-to-shin  Sensory: He reports symmetric sensation to touch and temperature in the arms and legs.  DTRs:   Reflexes are normal and symmetric in the arms. Reflexes are brisk in the legs. There is no ankle clonus.  Gait and station: Station is normal.   Gait is normal. Tandem gait is normal . Romberg is negative.      DIAGNOSTIC DATA (LABS, IMAGING, TESTING) - I reviewed patient records, labs,  notes, testing and imaging myself where available.  Lab Results  Component Value Date   WBC 2.6 (L) 07/13/2015   HGB 14.8 12/22/2013   HCT 44.5 07/13/2015   MCV 81 07/13/2015   PLT 227 07/13/2015      Component Value Date/Time   NA 141 07/13/2015 0852   K 4.4 07/13/2015 0852   CL 100 07/13/2015 0852   CO2 22 07/13/2015 0852   GLUCOSE 100 (H) 07/13/2015 0852   BUN 12 07/13/2015 0852   CREATININE 1.06 07/13/2015 0852   CALCIUM 9.4 07/13/2015 0852   PROT 6.9 07/13/2015 0852   ALBUMIN 4.3 07/13/2015 0852   AST 21 07/13/2015 0852   ALT 44 07/13/2015 0852   ALKPHOS 69 07/13/2015 0852   BILITOT 0.5 07/13/2015 0852   GFRNONAA 95 07/13/2015 0852   GFRAA 110 07/13/2015 0852   No results found for: CHOL, HDL, LDLCALC, LDLDIRECT, TRIG, CHOLHDL No results found for: RWER1V Lab Results  Component Value Date   VITAMINB12 469 12/22/2013   Lab Results  Component Value Date   TSH 2.300 12/22/2013       ASSESSMENT AND PLAN  Multiple sclerosis (HCC)  Optic neuritis  Numbness  High risk medication use  Urinary hesitancy   1.  Continue ocrelizumab. He will complete the first dose next week and come back in 6 months for the second dose. 2.  Continue Vit D 3.  Note for work [facility]) 4.   He will return to see me in 6 months or sooner if there are new or worsening neurologic symptoms.      Phylisha Dix A. Epimenio Foot, MD, PhD 11/03/2015, 4:17 PM Certified in Neurology, Clinical Neurophysiology, Sleep Medicine, Pain Medicine and Neuroimaging  [facility] [address] [place], [place] [address] [phone]

## 2015-11-07 NOTE — Telephone Encounter (Signed)
11-03-15 ov note faxed as requested/fim

## 2015-11-07 NOTE — Telephone Encounter (Signed)
Patient called, states employer needs copy of detailed evaluation from last Thursday's visit. Please call [phone].

## 2015-11-07 NOTE — Telephone Encounter (Signed)
Pt returned RN's call. He said to fax notes from last OV, all documentation from last OV, clinical notes to [facility]  (f) [phone]

## 2015-11-07 NOTE — Telephone Encounter (Signed)
I am happy to send ov notes from last week's visit with RAS anywhere he needs me to.  Just need to confirm--is he requesting I send notes to [facility]?  If so, I have that fax#/fim

## 2015-11-30 ENCOUNTER — Telehealth: Payer: Self-pay | Admitting: *Deleted

## 2015-11-30 NOTE — Telephone Encounter (Signed)
LMOM for pt.  I am finished with his FMLA paperwork.  Does he want me to fax it to [facility] or does he want to pick it up in the office?/fim

## 2015-12-06 NOTE — Telephone Encounter (Signed)
Pt called back, he would like form to be faxed with CASE # [id number]. He wants you to know he really appreciates this, thank you!

## 2015-12-06 NOTE — Telephone Encounter (Signed)
done/fim 

## 2015-12-12 ENCOUNTER — Telehealth: Payer: Self-pay | Admitting: *Deleted

## 2015-12-12 NOTE — Telephone Encounter (Signed)
Patient called to check on status of FMLA forms, stated he called last week with the case #. Advised him [employer] note stated it had been dione. He verbalized understanding, appreciation.

## 2015-12-13 ENCOUNTER — Ambulatory Visit: Payer: BLUE CROSS/BLUE SHIELD | Admitting: Neurology

## 2016-03-08 ENCOUNTER — Telehealth: Payer: Self-pay | Admitting: Neurology

## 2016-03-08 NOTE — Telephone Encounter (Signed)
I have spoken with Caryn Bee.  Brochures for cooling vests and other cooling products mailed to his home address, along with the phone # for the [employer] [phone]).  They may be able to help cover the cost of a vest for him/fim

## 2016-03-08 NOTE — Telephone Encounter (Signed)
Patient called in reference to a cooling pack for MS.  Patient states he has misplaced the information and requesting if it can be printed and he can pick it up.  Please

## 2016-05-10 ENCOUNTER — Ambulatory Visit: Payer: BLUE CROSS/BLUE SHIELD | Admitting: Neurology

## 2016-05-14 ENCOUNTER — Ambulatory Visit (INDEPENDENT_AMBULATORY_CARE_PROVIDER_SITE_OTHER): Payer: 59 | Admitting: Neurology

## 2016-05-14 ENCOUNTER — Ambulatory Visit: Payer: Self-pay | Admitting: Neurology

## 2016-05-14 ENCOUNTER — Encounter: Payer: Self-pay | Admitting: Neurology

## 2016-05-14 ENCOUNTER — Telehealth: Payer: Self-pay | Admitting: Neurology

## 2016-05-14 VITALS — BP 150/98 | HR 90 | Resp 14 | Ht 69.0 in | Wt 240.0 lb

## 2016-05-14 DIAGNOSIS — Z79899 Other long term (current) drug therapy: Secondary | ICD-10-CM

## 2016-05-14 DIAGNOSIS — G35 Multiple sclerosis: Secondary | ICD-10-CM

## 2016-05-14 DIAGNOSIS — R200 Anesthesia of skin: Secondary | ICD-10-CM

## 2016-05-14 DIAGNOSIS — R270 Ataxia, unspecified: Secondary | ICD-10-CM

## 2016-05-14 DIAGNOSIS — R350 Frequency of micturition: Secondary | ICD-10-CM

## 2016-05-14 NOTE — Progress Notes (Signed)
[facility]  PATIENT: Lucas Berry DOB: 1985-11-05     HISTORICAL  CHIEF COMPLAINT:  Chief Complaint  Patient presents with  . Multiple Sclerosis    Sts. he continues to tolerate Ocrelizumab well.  Denies new or worsening sx/fim    HISTORY OF PRESENT ILLNESS:  Lucas Berry is a 31 year old man with MS diagnosed  in early 2016.     MS:   He switched from Gilenya to ocrelizumab in late 2017 due to a desire to start a family and the possible risk of increased birth defects, even when the male is taking Gilenya.    His multiple sclerosis has been stable.  He has no exacerbations.  He tolerates it well. His last MRI 12/15/14 showed no new lesions.    He will be getting his next infusion soon (has not yet scheduled due to work requirements).  Gait/strength/sensation:   He walks more at his new job and walks on different surfaces.    He notes gait is slightly off .   No falls.  He can climb a ladder multiple times..    He has mild left hand > arm numbness but no numbness elsewhere and no dysesthetic pain.     No hand weakness  Vision:   Vision is back to baseline after the ON in 2016. No reduced acuity or change in color vision.    He denies diplopia  Bladder:    He feels the urinary frequency is better and has nocturia x 2.    He reported some hesitancy but no incontinence.   Flomax did not help. He has mild constipation.    Fatigue/sleep:   He denies much fatigue and stays active.    He hasn't gone back to working out in a gym but does walk several miles a day at work.    He sleeps well most nights and has 1 x nocturia with quick return to sleep.      Mood/cognition:   He denies any problems with depression or anxiety.   He has no cognitive issues.     MS History:   In December 2015 he had a several month history of dizziness, ataxic gait and hand numbness. MRI early December 2015 showed multiple white matter foci including several in the posterior fossa (left  middle cerebellar peduncle, pons, medulla) and periventricular foci in both hemispheres. He also had a spine focus at C2. There were no acute lesions (no enhancement, no DWI brightness).Blood work showed normal or negative vasculitis labs, ACE, Lyme, HIV, RPR, pan ANCA, and anti-NMO antibody. Visual evoked potentials were abnormal showing prolongation of the P100 to about 130 ms on either side.  Lumbar puncture on 03/05/2014 showed abnormal CSF with > 5 oligoclonal bands and elevated IgG index of 0.78.MRI of the brain 12/15/2014 was unchanged.    ROS:  Out of a complete 14 system review of symptoms, the patient complains only of the following symptoms, and all other reviewed systems are negative.  He notes difficulty with urinary frequency and mild hesitancy.    He has fatigue   ALLERGIES: No Known Allergies  HOME MEDICATIONS:  Current Outpatient Prescriptions:  .  amLODipine (NORVASC) 5 MG tablet, Take 5 mg by mouth daily., Disp: , Rfl: 0 .  IFEREX 150 150 MG capsule, Take 150 mg by mouth daily., Disp: , Rfl: 0 .  ocrelizumab 600 mg in sodium chloride 0.9 % 500 mL, Inject 600 mg into the vein every 6 (six) months., Disp: ,  Rfl:  .  Vitamin D, Ergocalciferol, (DRISDOL) 50000 units CAPS capsule, Take 1 capsule (50,000 Units total) by mouth every 7 (seven) days., Disp: 12 capsule, Rfl: 0  PAST MEDICAL HISTORY: Past Medical History:  Diagnosis Date  . Carpal tunnel syndrome   . Hypertension   . Movement disorder   . Multiple sclerosis (HCC)   . Vision abnormalities     PAST SURGICAL HISTORY: Past Surgical History:  Procedure Laterality Date  . MOUTH SURGERY  10 years ago    FAMILY HISTORY: Family History  Problem Relation Age of Onset  . High blood pressure Mother   . Cancer Maternal Grandmother   . High blood pressure Father     SOCIAL HISTORY:  Social History   Social History  . Marital status: Single    Spouse name: N/A  . Number of children: 2  . Years of  education: Assoc   Occupational History  .  Other    [employer]   Social History Main Topics  . Smoking status: Never Smoker  . Smokeless tobacco: Never Used  . Alcohol use 0.0 oz/week     Comment: weekly  . Drug use: No  . Sexual activity: Not on file   Other Topics Concern  . Not on file   Social History Narrative   Patient lives at home with his family.   Caffeine use: occasionally   Patient has a college education    Patient has 2 children    Patient is right handed      PHYSICAL EXAM  Vitals:   05/14/16 1504  BP: (!) 150/98  Pulse: 90  Resp: 14  Weight: 240 lb (108.9 kg)  Height: 5\' 9"  (1.753 m)    Body mass index is 35.44 kg/m.   General: The patient is well-developed and well-nourished and in no acute distress    Neurologic Exam  Mental status: The patient is alert and oriented x 3 at the time of the examination. The patient has apparent normal recent and remote memory, with an apparently normal attention span and concentration ability.   Speech is normal.  Cranial nerves: Extraocular movements are full. Pupils show mild left APD.  Color vision is symmetric.. Facial symmetry is present. There is good facial sensation to soft touch bilaterally.Facial strength is normal.  Trapezius and sternocleidomastoid strength is normal. No dysarthria is noted.   Motor:  Muscle bulk is normal.   Tone is normal. Strength is  5 / 5 in all 4 extremities.   Coordination: Cerebellar testing reveals good finger-nose-finger and heel-to-shin  Sensory: He reports symmetric sensation to touch and temperature in the arms and legs.  DTRs:   Reflexes are normal and symmetric in the arms. Reflexes are brisk in the legs but no spread or clonus.  Gait and station: Station is normal.   Gait is normal. Tandem gait is normal . Romberg is negative.   Other:   Left wrist Tinel's sign    DIAGNOSTIC DATA (LABS, IMAGING, TESTING) - I reviewed patient records, labs, notes,  testing and imaging myself where available.  Lab Results  Component Value Date   WBC 2.6 (L) 07/13/2015   HGB 14.8 12/22/2013   HCT 44.5 07/13/2015   MCV 81 07/13/2015   PLT 227 07/13/2015      Component Value Date/Time   NA 141 07/13/2015 0852   K 4.4 07/13/2015 0852   CL 100 07/13/2015 0852   CO2 22 07/13/2015 0852   GLUCOSE 100 (H) 07/13/2015  0852   BUN 12 07/13/2015 0852   CREATININE 1.06 07/13/2015 0852   CALCIUM 9.4 07/13/2015 0852   PROT 6.9 07/13/2015 0852   ALBUMIN 4.3 07/13/2015 0852   AST 21 07/13/2015 0852   ALT 44 07/13/2015 0852   ALKPHOS 69 07/13/2015 0852   BILITOT 0.5 07/13/2015 0852   GFRNONAA 95 07/13/2015 0852   GFRAA 110 07/13/2015 0852   No results found for: CHOL, HDL, LDLCALC, LDLDIRECT, TRIG, CHOLHDL No results found for: ZOXW9U Lab Results  Component Value Date   VITAMINB12 469 12/22/2013   Lab Results  Component Value Date   TSH 2.300 12/22/2013       ASSESSMENT AND PLAN  Multiple sclerosis (HCC)  High risk medication use  Ataxia  Numbness  Urinary frequency   1.  Continue ocrelizumab. He will get his second dose soon.   Check CBC, CD19/20 and LFT.   2.  If hand numbness or pain worsens, consider a nerve conduction study to assess for carpal tunnel syndrome.  3.  Colace or similar for constipation.   4.   He will return to see me in 6 months or sooner if there are new or worsening neurologic symptoms.      Jannetta Massey A. Epimenio Foot, MD, PhD 05/14/2016, 3:07 PM Certified in Neurology, Clinical Neurophysiology, Sleep Medicine, Pain Medicine and Neuroimaging  [facility] [address] [place], [place] [address] [phone]

## 2016-05-14 NOTE — Telephone Encounter (Signed)
I have spoken with Lucas Berry today and given appt. for 1430 today/fim

## 2016-05-14 NOTE — Telephone Encounter (Signed)
hey pt: had an appointment today but he cancelled it.  he wanted to know if it is at all possible to be seen today since his work schedule has changed, please call.

## 2016-05-17 LAB — CBC WITH DIFFERENTIAL/PLATELET
BASOS: 0 %
Basophils Absolute: 0 10*3/uL (ref 0.0–0.2)
EOS (ABSOLUTE): 0 10*3/uL (ref 0.0–0.4)
EOS: 0 %
Hematocrit: 44.1 % (ref 37.5–51.0)
Hemoglobin: 14.6 g/dL (ref 13.0–17.7)
IMMATURE GRANS (ABS): 0 10*3/uL (ref 0.0–0.1)
IMMATURE GRANULOCYTES: 0 %
LYMPHS: 22 %
Lymphocytes Absolute: 1.2 10*3/uL (ref 0.7–3.1)
MCH: 26.2 pg — ABNORMAL LOW (ref 26.6–33.0)
MCHC: 33.1 g/dL (ref 31.5–35.7)
MCV: 79 fL (ref 79–97)
Monocytes Absolute: 0.4 10*3/uL (ref 0.1–0.9)
Monocytes: 7 %
NEUTROS PCT: 71 %
Neutrophils Absolute: 3.8 10*3/uL (ref 1.4–7.0)
PLATELETS: 228 10*3/uL (ref 150–379)
RBC: 5.58 x10E6/uL (ref 4.14–5.80)
RDW: 14.5 % (ref 12.3–15.4)
WBC: 5.4 10*3/uL (ref 3.4–10.8)

## 2016-05-17 LAB — CD19 AND CD20, FLOW CYTOMETRY
% CD19: 2 %
% CD20: 2 %

## 2016-05-17 LAB — HEPATIC FUNCTION PANEL
ALT: 29 IU/L (ref 0–44)
AST: 33 IU/L (ref 0–40)
Albumin: 4.6 g/dL (ref 3.5–5.5)
Alkaline Phosphatase: 68 IU/L (ref 39–117)
BILIRUBIN TOTAL: 0.4 mg/dL (ref 0.0–1.2)
BILIRUBIN, DIRECT: 0.09 mg/dL (ref 0.00–0.40)
Total Protein: 7 g/dL (ref 6.0–8.5)

## 2016-05-29 DIAGNOSIS — G35 Multiple sclerosis: Secondary | ICD-10-CM | POA: Diagnosis not present

## 2016-07-11 ENCOUNTER — Ambulatory Visit (INDEPENDENT_AMBULATORY_CARE_PROVIDER_SITE_OTHER): Payer: 59

## 2016-07-11 DIAGNOSIS — G35 Multiple sclerosis: Secondary | ICD-10-CM | POA: Diagnosis not present

## 2016-07-11 DIAGNOSIS — R270 Ataxia, unspecified: Secondary | ICD-10-CM

## 2016-07-11 MED ORDER — GADOPENTETATE DIMEGLUMINE 469.01 MG/ML IV SOLN: 20.0000 mL | Freq: Once | INTRAVENOUS | Status: AC | PRN

## 2016-07-16 ENCOUNTER — Telehealth: Payer: Self-pay | Admitting: *Deleted

## 2016-07-16 NOTE — Telephone Encounter (Signed)
-----   Message from Asa Lente, MD sent at 07/13/2016 12:30 PM EDT ----- Please let him know that the MRI of the brain does not show any new changes.

## 2016-07-16 NOTE — Telephone Encounter (Signed)
I have spoken with Lucas Berry this morning and per RAS, explained MRI brain shows no new MS changes.  He verbalized understanding of same/fim

## 2016-10-01 ENCOUNTER — Telehealth: Payer: Self-pay | Admitting: Neurology

## 2016-10-01 NOTE — Telephone Encounter (Signed)
Error ta

## 2016-10-04 ENCOUNTER — Telehealth: Payer: Self-pay | Admitting: *Deleted

## 2016-10-04 NOTE — Telephone Encounter (Signed)
Received signed med. rec. release form, request from pt's employer [employer]) for med rec. pertaining to MS dx.  05/14/16 ov note, 2018 MRI brain, and handwritten note that pt. is able to work full time without restrictions faxed back to [facility] [employer] [facility] fax# [phone]/fim

## 2016-11-20 DIAGNOSIS — K5900 Constipation, unspecified: Secondary | ICD-10-CM | POA: Diagnosis not present

## 2016-11-20 DIAGNOSIS — Z23 Encounter for immunization: Secondary | ICD-10-CM | POA: Diagnosis not present

## 2016-11-20 DIAGNOSIS — D509 Iron deficiency anemia, unspecified: Secondary | ICD-10-CM | POA: Diagnosis not present

## 2016-11-20 DIAGNOSIS — I10 Essential (primary) hypertension: Secondary | ICD-10-CM | POA: Diagnosis not present

## 2016-11-21 DIAGNOSIS — G35 Multiple sclerosis: Secondary | ICD-10-CM | POA: Diagnosis not present

## 2016-12-22 IMAGING — XA DG FLUORO GUIDE LUMBAR PUNCTURE
1 series · 1 of 1 positions shown · non-contrast
Comparison: none

CLINICAL DATA: Diagnostic workup for suspected demyelinating
disease. Initial encounter.

[Series 1: ortho standard · 1 of 1 slices shown]
[im 1/1]
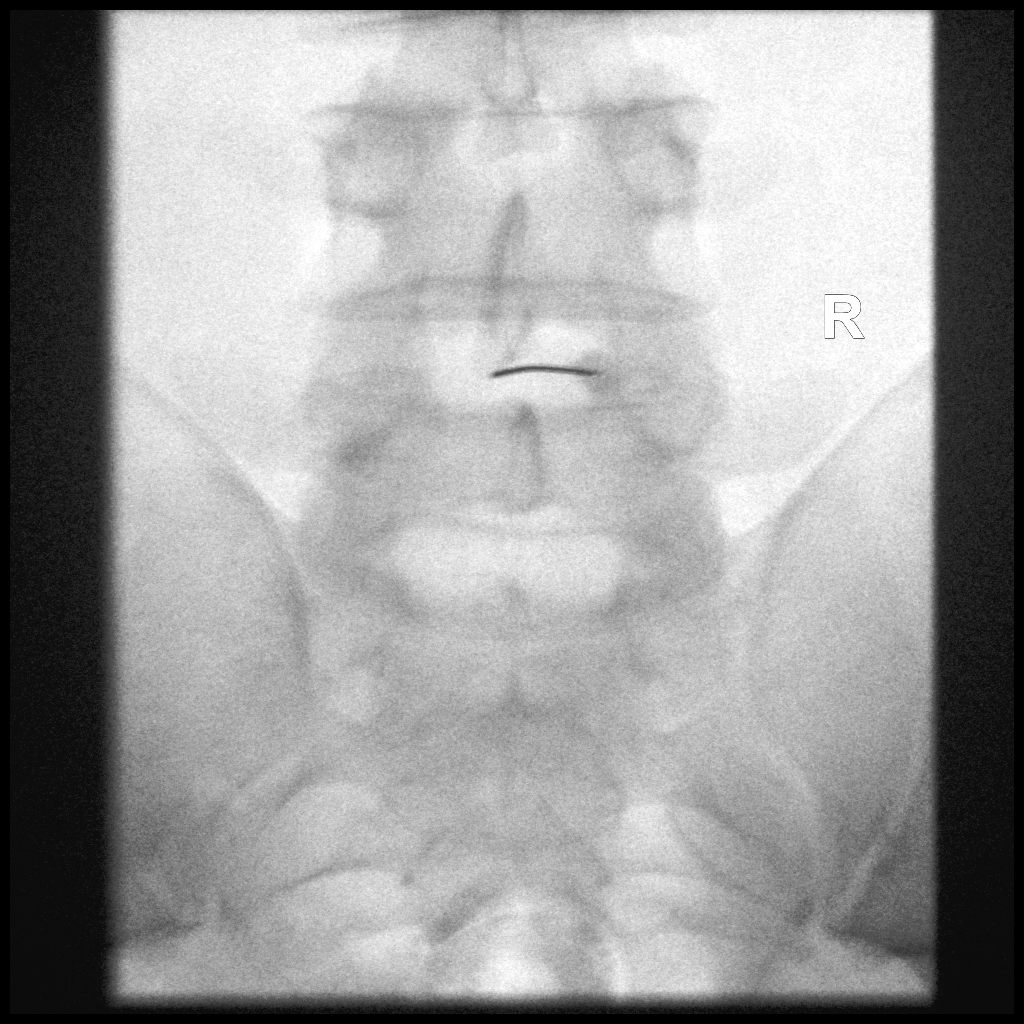

[1 of 1 positions shown; findings below may reference images not displayed]

EXAM:
DIAGNOSTIC LUMBAR PUNCTURE UNDER FLUOROSCOPIC GUIDANCE

FLUOROSCOPY TIME:  Radiation Exposure Index (as provided by the
fluoroscopic device): 51 ?Gy*m2

PROCEDURE:
Informed consent was obtained from the patient prior to the
procedure, including potential complications of headache, allergy,
and pain. With the patient prone, the lower back was prepped with
Betadine. 1% Lidocaine was used for local anesthesia. Lumbar
puncture was performed at the L4-5 level using a 20 gauge needle
with return of clear CSF with an opening pressure of 15 cm water.
14.0 ml of CSF were obtained for laboratory studies. The patient
tolerated the procedure well and there were no apparent
complications.

Patient was observed for 30 minutes and discharged home with
detailed instructions to remain flat for the next 24 hours.
IMPRESSION: Technically successful diagnostic lumbar puncture at L4-5. Clear
fluid with normal pressure.

## 2017-01-08 DIAGNOSIS — Z79899 Other long term (current) drug therapy: Secondary | ICD-10-CM | POA: Diagnosis not present

## 2017-01-17 ENCOUNTER — Telehealth: Payer: Self-pay | Admitting: *Deleted

## 2017-01-17 NOTE — Telephone Encounter (Signed)
Pt FMLA form on [place] desk. 

## 2017-01-21 NOTE — Telephone Encounter (Signed)
Spoke with pt. and let him know FMLA forms have been  completed and faxed to [facility], fax# [phone]. Copy mailed to pt. and copy sent to be scanned into EMR/fim

## 2017-01-31 ENCOUNTER — Encounter: Payer: Self-pay | Admitting: Neurology

## 2017-01-31 ENCOUNTER — Ambulatory Visit: Payer: 59 | Admitting: Neurology

## 2017-01-31 VITALS — BP 127/77 | HR 83 | Ht 69.0 in | Wt 239.0 lb

## 2017-01-31 DIAGNOSIS — R200 Anesthesia of skin: Secondary | ICD-10-CM

## 2017-01-31 DIAGNOSIS — H469 Unspecified optic neuritis: Secondary | ICD-10-CM | POA: Diagnosis not present

## 2017-01-31 DIAGNOSIS — R350 Frequency of micturition: Secondary | ICD-10-CM

## 2017-01-31 DIAGNOSIS — Z79899 Other long term (current) drug therapy: Secondary | ICD-10-CM | POA: Diagnosis not present

## 2017-01-31 DIAGNOSIS — R270 Ataxia, unspecified: Secondary | ICD-10-CM

## 2017-01-31 DIAGNOSIS — G35 Multiple sclerosis: Secondary | ICD-10-CM

## 2017-01-31 NOTE — Progress Notes (Signed)
[facility]  PATIENT: Lucas Berry DOB: January 19, 1985     HISTORICAL  CHIEF COMPLAINT:  Chief Complaint  Patient presents with  . Multiple Sclerosis    Patient was on Gilenya but was switched to Ocrevus - he had his 3rd dose in December - says he's doing well    HISTORY OF PRESENT ILLNESS:  Lucas Berry is a 32 year old man with MS diagnosed  in early 2016.     Update 01/31/2017: He feels his MS is doing fairly well. He has had 3 courses of ocrelizumab, with the last one being in December.He denies any difficulty with new exacerbations. The infusions went well.  I reviewed the MRI dated 07/11/2016 and compared it to the last MRI.   He has lesions in the spinal cord, brain stem, cerebellum and the hemispheres.   However, none of them appear acute. Additionally, when compared to the 2016 MRI, there was no interval change.  He denies any major problems with his gait though balance is occasionally mildly off. He climbs ladders multiple times a day. He has not had any falls. He does note some numbness in the left hand and arm but not elsewhere. There is no pain or weakness in the limbs. He had optic neuritis in 2016 but he feels his vision returned to baseline.    Hie has had some diplopia after exercising and feels his balance was a little worse after playing basketball and working up a sweat.   He does note improvement in urinary frequency and has some nocturia. There was also some hesitancy but he did not get a benefit from Flomax.   He has some fatigue but this is usually manageable. He sleeps well though he typically will have nocturia once or twice.  He denies difficulties with mood or cognition.    From 05/14/2016: MS:   He switched from Gilenya to ocrelizumab in late 2017 due to a desire to start a family and the possible risk of increased birth defects, even when the male is taking Gilenya.    His multiple sclerosis has been stable.  He has no exacerbations.  He  tolerates it well. His last MRI 12/15/14 showed no new lesions.    He will be getting his next infusion soon (has not yet scheduled due to work requirements).  Gait/strength/sensation:   He walks more at his new job and walks on different surfaces.    He notes gait is slightly off .   No falls.  He can climb a ladder multiple times..    He has mild left hand > arm numbness but no numbness elsewhere and no dysesthetic pain.     No hand weakness  Vision:   Vision is back to baseline after the ON in 2016. No reduced acuity or change in color vision.    He denies diplopia  Bladder:    He feels the urinary frequency is better and has nocturia x 2.    He reported some hesitancy but no incontinence.   Flomax did not help. He has mild constipation.    Fatigue/sleep:   He denies much fatigue and stays active.       He sleeps well most nights and has 1-2 x nocturia with quick return to sleep.    However, he sometimes works an additional shift and sleeps wrorse  Mood/cognition:   He denies any problems with depression or anxiety.   He has no cognitive issues.     MS History:  In December 2015 he had a several month history of dizziness, ataxic gait and hand numbness. MRI early December 2015 showed multiple white matter foci including several in the posterior fossa (left middle cerebellar peduncle, pons, medulla) and periventricular foci in both hemispheres. He also had a spine focus at C2. There were no acute lesions (no enhancement, no DWI brightness).Blood work showed normal or negative vasculitis labs, ACE, Lyme, HIV, RPR, pan ANCA, and anti-NMO antibody. Visual evoked potentials were abnormal showing prolongation of the P100 to about 130 ms on either side.  Lumbar puncture on 03/05/2014 showed abnormal CSF with > 5 oligoclonal bands and elevated IgG index of 0.78.MRI of the brain 12/15/2014 was unchanged.    ROS:  Out of a complete 14 system review of symptoms, the patient complains only of the  following symptoms, and all other reviewed systems are negative.  He notes difficulty with urinary frequency and mild hesitancy.    He has fatigue   ALLERGIES: No Known Allergies  HOME MEDICATIONS:  Current Outpatient Medications:  .  amLODipine (NORVASC) 5 MG tablet, Take 5 mg by mouth daily., Disp: , Rfl: 0 .  IFEREX 150 150 MG capsule, Take 150 mg by mouth daily., Disp: , Rfl: 0 .  ocrelizumab 600 mg in sodium chloride 0.9 % 500 mL, Inject 600 mg into the vein every 6 (six) months., Disp: , Rfl:  .  Vitamin D, Ergocalciferol, (DRISDOL) 50000 units CAPS capsule, Take 1 capsule (50,000 Units total) by mouth every 7 (seven) days., Disp: 12 capsule, Rfl: 0 No current facility-administered medications for this visit.   Facility-Administered Medications Ordered in Other Visits:  .  gadopentetate dimeglumine (MAGNEVIST) injection 20 mL, 20 mL, Intravenous, Once PRN, Chang Tiggs, Pearletha Furl, MD  PAST MEDICAL HISTORY: Past Medical History:  Diagnosis Date  . Carpal tunnel syndrome   . Hypertension   . Movement disorder   . Multiple sclerosis (HCC)   . Vision abnormalities     PAST SURGICAL HISTORY: Past Surgical History:  Procedure Laterality Date  . MOUTH SURGERY  10 years ago    FAMILY HISTORY: Family History  Problem Relation Age of Onset  . High blood pressure Mother   . Cancer Maternal Grandmother   . High blood pressure Father     SOCIAL HISTORY:  Social History   Socioeconomic History  . Marital status: Single    Spouse name: Not on file  . Number of children: 2  . Years of education: Assoc  . Highest education level: Not on file  Social Needs  . Financial resource strain: Not on file  . Food insecurity - worry: Not on file  . Food insecurity - inability: Not on file  . Transportation needs - medical: Not on file  . Transportation needs - non-medical: Not on file  Occupational History    Employer: OTHER    Comment: [employer]  Tobacco Use  . Smoking  status: Never Smoker  . Smokeless tobacco: Never Used  Substance and Sexual Activity  . Alcohol use: Yes    Alcohol/week: 0.0 oz    Comment: weekly  . Drug use: No  . Sexual activity: Not on file  Other Topics Concern  . Not on file  Social History Narrative   Patient lives at home with his family.   Caffeine use: occasionally   Patient has a college education    Patient has 2 children    Patient is right handed      PHYSICAL EXAM  Vitals:   01/31/17 0844  BP: 127/77  Pulse: 83  Weight: 239 lb (108.4 kg)  Height: 5\' 9"  (1.753 m)    Body mass index is 35.29 kg/m.   General: The patient is well-developed and well-nourished and in no acute distress    Neurologic Exam  Mental status: The patient is alert and oriented x 3 at the time of the examination. The patient has apparent normal recent and remote memory, with an apparently normal attention span and concentration ability.   Speech is normal.  Cranial nerves: Extraocular movements are full. He has a mild left APD though color vision is symmetric. Facial strength and sensation is normal. Trapezius strength is strong.  No dysarthria is noted. Hearing appears normal and symmetric.  Motor:  Muscle bulk is normal.   Tone is normal. Strength is  5 / 5 in all 4 extremities.   Coordination: Cerebellar testing reveals good finger-nose-finger and heel-to-shin  Sensory: He reports symmetric sensation to touch and temperature in the arms and legs.  DTRs:   Reflexes are normal and symmetric in the arms. Reflexes are 3+ in the legs but there is no spread or clonus..  Gait and station: Station is normal.   Gait and tandem gait are normal. Romberg is negative..   Other:   Left wrist Tinel's sign    DIAGNOSTIC DATA (LABS, IMAGING, TESTING) - I reviewed patient records, labs, notes, testing and imaging myself where available.  Lab Results  Component Value Date   WBC 5.4 05/14/2016   HGB 14.6 05/14/2016   HCT 44.1 05/14/2016    MCV 79 05/14/2016   PLT 228 05/14/2016      Component Value Date/Time   NA 141 07/13/2015 0852   K 4.4 07/13/2015 0852   CL 100 07/13/2015 0852   CO2 22 07/13/2015 0852   GLUCOSE 100 (H) 07/13/2015 0852   BUN 12 07/13/2015 0852   CREATININE 1.06 07/13/2015 0852   CALCIUM 9.4 07/13/2015 0852   PROT 7.0 05/14/2016 1520   ALBUMIN 4.6 05/14/2016 1520   AST 33 05/14/2016 1520   ALT 29 05/14/2016 1520   ALKPHOS 68 05/14/2016 1520   BILITOT 0.4 05/14/2016 1520   GFRNONAA 95 07/13/2015 0852   GFRAA 110 07/13/2015 0852   No results found for: CHOL, HDL, LDLCALC, LDLDIRECT, TRIG, CHOLHDL No results found for: WOEH2Z Lab Results  Component Value Date   VITAMINB12 469 12/22/2013   Lab Results  Component Value Date   TSH 2.300 12/22/2013       ASSESSMENT AND PLAN  Multiple sclerosis (HCC)  Optic neuritis  Ataxia  Urinary frequency  High risk medication use  Numbness   1.  Continue ocrelizumab. I will check the IgG/IgM/IgA today. His next dose will be May 2.  Continue to be active. He is going to try to exercise more.  3.   He will return to see me in 6 months or sooner if there are new or worsening neurologic symptoms.      Hatim Homann A. Epimenio Foot, MD, PhD 01/31/2017, 9:19 AM Certified in Neurology, Clinical Neurophysiology, Sleep Medicine, Pain Medicine and Neuroimaging  [facility] [address] [place], [place] [address] [phone]

## 2017-02-01 LAB — CBC WITH DIFFERENTIAL/PLATELET
BASOS: 0 %
Basophils Absolute: 0 10*3/uL (ref 0.0–0.2)
EOS (ABSOLUTE): 0 10*3/uL (ref 0.0–0.4)
Eos: 1 %
HEMATOCRIT: 43 % (ref 37.5–51.0)
Hemoglobin: 14.1 g/dL (ref 13.0–17.7)
IMMATURE GRANS (ABS): 0 10*3/uL (ref 0.0–0.1)
IMMATURE GRANULOCYTES: 0 %
LYMPHS: 24 %
Lymphocytes Absolute: 1.2 10*3/uL (ref 0.7–3.1)
MCH: 26.2 pg — AB (ref 26.6–33.0)
MCHC: 32.8 g/dL (ref 31.5–35.7)
MCV: 80 fL (ref 79–97)
Monocytes Absolute: 0.5 10*3/uL (ref 0.1–0.9)
Monocytes: 9 %
NEUTROS PCT: 66 %
Neutrophils Absolute: 3.3 10*3/uL (ref 1.4–7.0)
PLATELETS: 268 10*3/uL (ref 150–379)
RBC: 5.38 x10E6/uL (ref 4.14–5.80)
RDW: 14.3 % (ref 12.3–15.4)
WBC: 5 10*3/uL (ref 3.4–10.8)

## 2017-02-01 LAB — IGG, IGA, IGM
IGA/IMMUNOGLOBULIN A, SERUM: 138 mg/dL (ref 90–386)
IGG (IMMUNOGLOBIN G), SERUM: 948 mg/dL (ref 700–1600)
IgM (Immunoglobulin M), Srm: 40 mg/dL (ref 20–172)

## 2017-04-04 ENCOUNTER — Other Ambulatory Visit: Payer: Self-pay | Admitting: Neurology

## 2017-05-22 DIAGNOSIS — G35 Multiple sclerosis: Secondary | ICD-10-CM | POA: Diagnosis not present

## 2017-07-31 ENCOUNTER — Ambulatory Visit: Payer: 59 | Admitting: Neurology

## 2017-08-16 ENCOUNTER — Telehealth: Payer: Self-pay | Admitting: Neurology

## 2017-08-16 NOTE — Telephone Encounter (Signed)
Pt is asking for a call from RN Faith to discuss FMLA paperwork

## 2017-08-16 NOTE — Telephone Encounter (Signed)
LMTC./fim 

## 2017-08-19 NOTE — Telephone Encounter (Signed)
Spoke with Tremar--he has a pending appt. in October, but needs FMLA forms completed by the end of next week.  His wife will drop these off for me to complete/fim

## 2017-09-03 ENCOUNTER — Telehealth: Payer: Self-pay | Admitting: *Deleted

## 2017-09-03 NOTE — Telephone Encounter (Signed)
Pt FMLA form on Faith desk. 

## 2017-09-03 NOTE — Telephone Encounter (Signed)
FMLA forms completed and returned to medical records/fim 

## 2017-09-04 ENCOUNTER — Telehealth: Payer: Self-pay | Admitting: *Deleted

## 2017-09-04 DIAGNOSIS — Z0289 Encounter for other administrative examinations: Secondary | ICD-10-CM

## 2017-09-04 NOTE — Telephone Encounter (Signed)
I faxed pt FMLA form to [phone]

## 2017-10-15 DIAGNOSIS — I10 Essential (primary) hypertension: Secondary | ICD-10-CM | POA: Diagnosis not present

## 2017-10-15 DIAGNOSIS — Z0000 Encounter for general adult medical examination without abnormal findings: Secondary | ICD-10-CM | POA: Diagnosis not present

## 2017-10-15 DIAGNOSIS — Z23 Encounter for immunization: Secondary | ICD-10-CM | POA: Diagnosis not present

## 2017-10-18 ENCOUNTER — Encounter: Payer: Self-pay | Admitting: Neurology

## 2017-10-18 ENCOUNTER — Ambulatory Visit (INDEPENDENT_AMBULATORY_CARE_PROVIDER_SITE_OTHER): Payer: 59 | Admitting: Neurology

## 2017-10-18 VITALS — BP 130/86 | HR 77 | Ht 69.0 in | Wt 244.0 lb

## 2017-10-18 DIAGNOSIS — Z79899 Other long term (current) drug therapy: Secondary | ICD-10-CM | POA: Diagnosis not present

## 2017-10-18 DIAGNOSIS — Z8669 Personal history of other diseases of the nervous system and sense organs: Secondary | ICD-10-CM

## 2017-10-18 DIAGNOSIS — R3911 Hesitancy of micturition: Secondary | ICD-10-CM | POA: Diagnosis not present

## 2017-10-18 DIAGNOSIS — G35 Multiple sclerosis: Secondary | ICD-10-CM | POA: Diagnosis not present

## 2017-10-18 DIAGNOSIS — R270 Ataxia, unspecified: Secondary | ICD-10-CM | POA: Diagnosis not present

## 2017-10-18 MED ORDER — SILODOSIN 8 MG PO CAPS: 8.0000 mg | ORAL_CAPSULE | Freq: Every day | ORAL | 11 refills | Status: DC

## 2017-10-18 NOTE — Addendum Note (Signed)
Addended by: Despina Arias A on: 10/18/2017 09:21 AM   Modules accepted: Orders

## 2017-10-18 NOTE — Progress Notes (Signed)
[facility]  PATIENT: Lucas Berry DOB: [birth year]     HISTORICAL  CHIEF COMPLAINT:  Chief Complaint  Patient presents with  . Multiple Sclerosis    Feels he is doing well on Ocrevus.  His only concern today is that his left knee will give out at times.  Denies having any falls.     HISTORY OF PRESENT ILLNESS:  Lucas Berry is a 32 y.o. man man with MS diagnosed  in early 2016.     Update 10/18/2017: He feels his MS is stable.   He is on Ocrevus and tolerated well.  Lab work at the last visit was normal.  His last MRI was about a year and a half ago and we discussed repeating it to make sure that he is not having breakthrough disease.  He has occasional episodes of the left leg giving out, especially with multiple flights of stairs or with running.     Regular walking is good and he can easily walk 3 miles.    He denies weakness or numbness.    Bladder is about the same with some hesitancy. Vision is stable.    He notes mild fatigue intermittently.   He does ok in the heat as long as he take breaks and hydrates.    He sleeps ok and gets 7 hours or so.    Mood is good.  Cognition is fine.     Update 01/31/2017: He feels his MS is doing fairly well. He has had 3 courses of ocrelizumab, with the last one being in December.He denies any difficulty with new exacerbations. The infusions went well.  I reviewed the MRI dated 07/11/2016 and compared it to the last MRI.   He has lesions in the spinal cord, brain stem, cerebellum and the hemispheres.   However, none of them appear acute. Additionally, when compared to the 2016 MRI, there was no interval change.  He denies any major problems with his gait though balance is occasionally mildly off. He climbs ladders multiple times a day. He has not had any falls. He does note some numbness in the left hand and arm but not elsewhere. There is no pain or weakness in the limbs. He had optic neuritis in 2016 but he feels his vision  returned to baseline.    Hie has had some diplopia after exercising and feels his balance was a little worse after playing basketball and working up a sweat.   He does note improvement in urinary frequency and has some nocturia. There was also some hesitancy but he did not get a benefit from Flomax.   He has some fatigue but this is usually manageable. He sleeps well though he typically will have nocturia once or twice.  He denies difficulties with mood or cognition.   From 05/14/2016: MS:   He switched from Gilenya to ocrelizumab in late 2017 due to a desire to start a family and the possible risk of increased birth defects, even when the male is taking Gilenya.    His multiple sclerosis has been stable.  He has no exacerbations.  He tolerates it well. His last MRI 12/15/14 showed no new lesions.    He will be getting his next infusion soon (has not yet scheduled due to work requirements).  Gait/strength/sensation:   He walks more at his new job and walks on different surfaces.    He notes gait is slightly off .   No falls.  He can climb a  ladder multiple times..    He has mild left hand > arm numbness but no numbness elsewhere and no dysesthetic pain.     No hand weakness  Vision:   Vision is back to baseline after the ON in 2016. No reduced acuity or change in color vision.    He denies diplopia  Bladder:    He feels the urinary frequency is better and has nocturia x 2.    He reported some hesitancy but no incontinence.   Flomax did not help. He has mild constipation.    Fatigue/sleep:   He denies much fatigue and stays active.       He sleeps well most nights and has 1-2 x nocturia with quick return to sleep.    However, he sometimes works an additional shift and sleeps wrorse  Mood/cognition:   He denies any problems with depression or anxiety.   He has no cognitive issues.     MS History:   In December 2015 he had a several month history of dizziness, ataxic gait and hand numbness. MRI  early December 2015 showed multiple white matter foci including several in the posterior fossa (left middle cerebellar peduncle, pons, medulla) and periventricular foci in both hemispheres. He also had a spine focus at C2. There were no acute lesions (no enhancement, no DWI brightness).Blood work showed normal or negative vasculitis labs, ACE, Lyme, HIV, RPR, pan ANCA, and anti-NMO antibody. Visual evoked potentials were abnormal showing prolongation of the P100 to about 130 ms on either side.  Lumbar puncture on 03/05/2014 showed abnormal CSF with > 5 oligoclonal bands and elevated IgG index of 0.78.MRI of the brain 12/15/2014 was unchanged.    ROS:  Out of a complete 14 system review of symptoms, the patient complains only of the following symptoms, and all other reviewed systems are negative.  He notes difficulty with urinary frequency and mild hesitancy.    He has fatigue   ALLERGIES: No Known Allergies  HOME MEDICATIONS:  Current Outpatient Medications:  .  amLODipine (NORVASC) 5 MG tablet, Take 5 mg by mouth daily., Disp: , Rfl: 0 .  Cholecalciferol (VITAMIN D PO), Take 5,000 Units by mouth daily., Disp: , Rfl:  .  IFEREX 150 150 MG capsule, Take 150 mg by mouth daily., Disp: , Rfl: 0 .  OCREVUS 300 MG/10ML injection, INFUSE 600 MG INTRAVENOUSLY IN 500 ML OF NORMAL SALINE EVERY 6 MONTHS. INFUSE OVER 3.5 HOURS OR LONGER., Disp: 2 each, Rfl: 1 .  silodosin (RAPAFLO) 8 MG CAPS capsule, Take 1 capsule (8 mg total) by mouth daily with breakfast., Disp: 30 capsule, Rfl: 11 No current facility-administered medications for this visit.   Facility-Administered Medications Ordered in Other Visits:  .  gadopentetate dimeglumine (MAGNEVIST) injection 20 mL, 20 mL, Intravenous, Once PRN, Renell Coaxum, Pearletha Furl, MD  PAST MEDICAL HISTORY: Past Medical History:  Diagnosis Date  . Carpal tunnel syndrome   . Hypertension   . Movement disorder   . Multiple sclerosis (HCC)   . Vision  abnormalities     PAST SURGICAL HISTORY: Past Surgical History:  Procedure Laterality Date  . MOUTH SURGERY  10 years ago    FAMILY HISTORY: Family History  Problem Relation Age of Onset  . High blood pressure Mother   . Cancer Maternal Grandmother   . High blood pressure Father     SOCIAL HISTORY:  Social History   Socioeconomic History  . Marital status: Single    Spouse name: Not on file  .  Number of children: 2  . Years of education: Assoc  . Highest education level: Not on file  Occupational History    Employer: OTHER    Comment: [employer]  Social Needs  . Financial resource strain: Not on file  . Food insecurity:    Worry: Not on file    Inability: Not on file  . Transportation needs:    Medical: Not on file    Non-medical: Not on file  Tobacco Use  . Smoking status: Never Smoker  . Smokeless tobacco: Never Used  Substance and Sexual Activity  . Alcohol use: Yes    Alcohol/week: 0.0 standard drinks    Comment: weekly  . Drug use: No  . Sexual activity: Not on file  Lifestyle  . Physical activity:    Days per week: Not on file    Minutes per session: Not on file  . Stress: Not on file  Relationships  . Social connections:    Talks on phone: Not on file    Gets together: Not on file    Attends religious service: Not on file    Active member of club or organization: Not on file    Attends meetings of clubs or organizations: Not on file    Relationship status: Not on file  . Intimate partner violence:    Fear of current or ex partner: Not on file    Emotionally abused: Not on file    Physically abused: Not on file    Forced sexual activity: Not on file  Other Topics Concern  . Not on file  Social History Narrative   Patient lives at home with his family.   Caffeine use: occasionally   Patient has a college education    Patient has 2 children    Patient is right handed      PHYSICAL EXAM  Vitals:   10/18/17 0844  BP: 130/86    Pulse: 77  Weight: 244 lb (110.7 kg)  Height: 5\' 9"  (1.753 m)    Body mass index is 36.03 kg/m.   General: The patient is well-developed and well-nourished and in no acute distress    Neurologic Exam  Mental status: The patient is alert and oriented x 3 at the time of the examination. The patient has apparent normal recent and remote memory, with an apparently normal attention span and concentration ability.   Speech is normal.  Cranial nerves: Extraocular movements are full.  There is a slight left APD.  Color vision is symmetric.  Facial strength and sensation is normal.  Trapezius strength is normal..  No dysarthria is noted. Hearing appears normal and symmetric.  Motor:  Muscle bulk is normal.   Tone is normal. Strength is  5 / 5 in all 4 extremities.   Coordination: Cerebellar testing shows good finger-nose-finger and heel-to-shin bilaterally  Sensory: He reports symmetric sensation to touch and temperature in the arms and legs.  DTRs:   Reflexes are normal and symmetric in the arms.  Reflexes are increased in the legs but there is no cross abductor reflex at the knee or clonus at the ankles  Gait and station: Station is normal.  Both the gait and the tandem gait are normal.. Romberg is negative.Marland Kitchen      DIAGNOSTIC DATA (LABS, IMAGING, TESTING) - I reviewed patient records, labs, notes, testing and imaging myself where available.  Lab Results  Component Value Date   WBC 5.0 01/31/2017   HGB 14.1 01/31/2017   HCT 43.0  01/31/2017   MCV 80 01/31/2017   PLT 268 01/31/2017      Component Value Date/Time   NA 141 07/13/2015 0852   K 4.4 07/13/2015 0852   CL 100 07/13/2015 0852   CO2 22 07/13/2015 0852   GLUCOSE 100 (H) 07/13/2015 0852   BUN 12 07/13/2015 0852   CREATININE 1.06 07/13/2015 0852   CALCIUM 9.4 07/13/2015 0852   PROT 7.0 05/14/2016 1520   ALBUMIN 4.6 05/14/2016 1520   AST 33 05/14/2016 1520   ALT 29 05/14/2016 1520   ALKPHOS 68 05/14/2016 1520    BILITOT 0.4 05/14/2016 1520   GFRNONAA 95 07/13/2015 0852   GFRAA 110 07/13/2015 0852   No results found for: CHOL, HDL, LDLCALC, LDLDIRECT, TRIG, CHOLHDL No results found for: [id number] Lab Results  Component Value Date   VITAMINB12 469 12/22/2013   Lab Results  Component Value Date   TSH 2.300 12/22/2013       ASSESSMENT AND PLAN  Multiple sclerosis (HCC) - Plan: MR BRAIN W WO CONTRAST, MR CERVICAL SPINE W WO CONTRAST  Urinary hesitancy  High risk medication use  Ataxia  History of optic neuritis   1.  Continue ocrelizumab.  Check MRI of the brain and cervical spine to determine if there is any breakthrough disease.  If present, consider a switch to different disease modifying therapy.    2.  Continue to be active. He is going to try to exercise more.  3.  Bladder function is mildly worse.  I will have him try Rapaflo to see if that helps.  Flomax has not helped.  If he does not get a benefit, consider referral to urology . 4.   He will return to see me in 6 months or sooner if there are new or worsening neurologic symptoms.      Uva Runkel A. Epimenio Foot, MD, PhD 10/18/2017, 9:12 AM Certified in Neurology, Clinical Neurophysiology, Sleep Medicine, Pain Medicine and Neuroimaging  [facility] [address], [address] [place], [place] [address] [phone]

## 2017-10-19 LAB — IGG, IGA, IGM
IGM (IMMUNOGLOBULIN M), SRM: 40 mg/dL (ref 20–172)
IgA/Immunoglobulin A, Serum: 140 mg/dL (ref 90–386)
IgG (Immunoglobin G), Serum: 976 mg/dL (ref 700–1600)

## 2017-10-19 LAB — CBC WITH DIFFERENTIAL/PLATELET
BASOS: 0 %
Basophils Absolute: 0 10*3/uL (ref 0.0–0.2)
EOS (ABSOLUTE): 0 10*3/uL (ref 0.0–0.4)
Eos: 1 %
Hematocrit: 45.4 % (ref 37.5–51.0)
Hemoglobin: 14.6 g/dL (ref 13.0–17.7)
IMMATURE GRANULOCYTES: 0 %
Immature Grans (Abs): 0 10*3/uL (ref 0.0–0.1)
LYMPHS ABS: 1.1 10*3/uL (ref 0.7–3.1)
Lymphs: 15 %
MCH: 25.7 pg — ABNORMAL LOW (ref 26.6–33.0)
MCHC: 32.2 g/dL (ref 31.5–35.7)
MCV: 80 fL (ref 79–97)
MONOS ABS: 0.6 10*3/uL (ref 0.1–0.9)
Monocytes: 8 %
NEUTROS PCT: 76 %
Neutrophils Absolute: 5.7 10*3/uL (ref 1.4–7.0)
PLATELETS: 253 10*3/uL (ref 150–450)
RBC: 5.67 x10E6/uL (ref 4.14–5.80)
RDW: 13.2 % (ref 12.3–15.4)
WBC: 7.5 10*3/uL (ref 3.4–10.8)

## 2017-10-21 ENCOUNTER — Telehealth: Payer: Self-pay | Admitting: *Deleted

## 2017-10-21 ENCOUNTER — Telehealth: Payer: Self-pay | Admitting: Neurology

## 2017-10-21 NOTE — Telephone Encounter (Signed)
lvm for pt to call back about scheduling MRI  [facility] Auth: [id number] (exp. 10/21/17 to 12/05/17) [employer] Auth: [id number] (exp. 10/21/17 to 12/05/17)

## 2017-10-21 NOTE — Telephone Encounter (Signed)
Spoke to patient he is going to give me a call back to schedule mri's.

## 2017-10-21 NOTE — Telephone Encounter (Signed)
Called, LVM (ok per DPR) advising lab work fine per Dr. Epimenio Foot. Gave [facility] phone number if he has further questions/concerns.

## 2017-10-21 NOTE — Telephone Encounter (Signed)
-----   Message from Richard A Sater, MD sent at 10/19/2017  2:13 PM EDT ----- Please let the patient know that the lab work is fine.  

## 2017-10-25 NOTE — Telephone Encounter (Signed)
Pt returning Emily's call to schedule MRI

## 2017-10-28 NOTE — Telephone Encounter (Signed)
Spoke to the patient he is scheduled for 11/05/17 at [facility].

## 2017-11-04 NOTE — Telephone Encounter (Signed)
Patient called and cancelled due to starting a new job wcb to r/s.

## 2017-11-05 ENCOUNTER — Other Ambulatory Visit: Payer: 59

## 2017-11-13 NOTE — Telephone Encounter (Signed)
Pt has called to be scheduled for his MRI, please call

## 2017-11-14 NOTE — Telephone Encounter (Signed)
lvm for pt to call back about scheduling mri's.

## 2017-11-18 NOTE — Telephone Encounter (Signed)
x2 lvm for pt to call back about scheduling mri  

## 2017-11-26 NOTE — Telephone Encounter (Signed)
Pt has called asking for a call to schedule his MRI, please call

## 2017-11-26 NOTE — Telephone Encounter (Signed)
Spoke to Patient he is scheduled for his MRI 12/04/2017 . Patient will bring his  $ 75.00 .

## 2017-12-04 ENCOUNTER — Ambulatory Visit: Payer: 59

## 2017-12-04 DIAGNOSIS — G35 Multiple sclerosis: Secondary | ICD-10-CM

## 2017-12-04 MED ORDER — GADOBENATE DIMEGLUMINE 529 MG/ML IV SOLN
20.0000 mL | Freq: Once | INTRAVENOUS | Status: AC | PRN
  Administered 2017-12-04: 20 mL via INTRAVENOUS

## 2017-12-09 ENCOUNTER — Telehealth: Payer: Self-pay | Admitting: *Deleted

## 2017-12-09 NOTE — Telephone Encounter (Signed)
-----   Message from Hillis Range, RN sent at 12/09/2017  7:37 AM EST -----   ----- Message ----- From: Asa Lente, MD Sent: 12/08/2017   4:29 PM EST To: Hillis Range, RN  Please let the patient know that the MRI's of the brain shows stable MS lesions  ---  None new since last MRI.      MRI of the cervical spine shows some MS lesions --- none look brand new.    (no old cervical spine MRI to compare)

## 2017-12-09 NOTE — Telephone Encounter (Signed)
LMOM with below MRI results.  Please call with any questions/fim 

## 2017-12-23 NOTE — Telephone Encounter (Signed)
Patient returning a call regarding MRI results.

## 2017-12-23 NOTE — Telephone Encounter (Signed)
Called, LVM for pt about results again.

## 2018-01-28 DIAGNOSIS — G35 Multiple sclerosis: Secondary | ICD-10-CM | POA: Diagnosis not present

## 2018-04-03 ENCOUNTER — Telehealth: Payer: Self-pay | Admitting: *Deleted

## 2018-04-03 NOTE — Telephone Encounter (Signed)
Received fax notification from [employer] (third party administrator for the [employer] medical plan) that Ocrevus approved 03/21/18-01/01/19. CPT code: J2350.  Questions: [phone]. Gave to [facility] for their records.

## 2018-04-16 ENCOUNTER — Telehealth: Payer: Self-pay | Admitting: Neurology

## 2018-04-16 NOTE — Telephone Encounter (Signed)
Just an FYI  I spoke to the patient and offered him to do a virtual visit for his appt on Monday 04/21/18 he stated he has to work that day and will not be able to do the visit.Marland Kitchen He wanted to cancel the appt, I offered to r/s but he stated he would call back at the beginning of next week to reschedule once he get his work schedule.

## 2018-04-16 NOTE — Telephone Encounter (Signed)
Noted  

## 2018-04-21 ENCOUNTER — Ambulatory Visit: Payer: 59 | Admitting: Neurology

## 2018-04-23 NOTE — Telephone Encounter (Signed)
Called, LVM for pt to call office back and schedule virtual visit next week with Dr. Epimenio Foot for his 6 month f/u. Last seen 10/2017.

## 2018-04-24 NOTE — Telephone Encounter (Signed)
Noted, will leave for Kara Mead to review when she returns to the office.

## 2018-04-24 NOTE — Telephone Encounter (Signed)
Pt called in and stated he would call back in next week to schedule appt after he checks his work schedule

## 2018-05-06 NOTE — Telephone Encounter (Signed)
Called, LVM for pt to call and schedule virtual visit next week with Dr. Epimenio Foot. He was last seen 10/2017. In order to continue receiving Ocrevus infusions he will need to schedule an appt asap. Gave [facility] phone number for call back.

## 2018-05-15 NOTE — Telephone Encounter (Signed)
Patient called office back. I scheduled f/u for 05/19/18 at 230pm with Dr. Epimenio Foot. I updated medication list/pharmacy/allergies on file. Texted him link for VV at [email or web address]. Advised him to call back if he does not receive link.  I also texted him link to sign up for mychart. He is going to send updated insurance cards.

## 2018-05-15 NOTE — Addendum Note (Signed)
Addended by: Hillis Range on: 05/15/2018 04:11 PM   Modules accepted: Orders

## 2018-05-19 ENCOUNTER — Ambulatory Visit (INDEPENDENT_AMBULATORY_CARE_PROVIDER_SITE_OTHER): Payer: BLUE CROSS/BLUE SHIELD | Admitting: Neurology

## 2018-05-19 ENCOUNTER — Encounter: Payer: Self-pay | Admitting: Neurology

## 2018-05-19 ENCOUNTER — Other Ambulatory Visit: Payer: Self-pay

## 2018-05-19 DIAGNOSIS — R350 Frequency of micturition: Secondary | ICD-10-CM

## 2018-05-19 DIAGNOSIS — E559 Vitamin D deficiency, unspecified: Secondary | ICD-10-CM | POA: Insufficient documentation

## 2018-05-19 DIAGNOSIS — Z8669 Personal history of other diseases of the nervous system and sense organs: Secondary | ICD-10-CM

## 2018-05-19 DIAGNOSIS — G35 Multiple sclerosis: Secondary | ICD-10-CM

## 2018-05-19 DIAGNOSIS — Z79899 Other long term (current) drug therapy: Secondary | ICD-10-CM

## 2018-05-19 NOTE — Progress Notes (Signed)
[facility]  PATIENT: Lucas Berry DOB: 20-Dec-1985     HISTORICAL  CHIEF COMPLAINT:  Chief Complaint  Patient presents with   Multiple Sclerosis    HISTORY OF PRESENT ILLNESS:  Lucas Berry is a 33 y.o. man man with MS diagnosed  in early 2016.     Update 05/19/2018: Virtual Visit via Video Note I connected with [name] on 05/19/18 at  2:30 PM EDT by a video enabled telemedicine application and verified that I am speaking with the correct person.  I discussed the limitations of evaluation and management by telemedicine and the availability of in person appointments. The patient expressed understanding and agreed to proceed.  History of Present Illness: He is on Ocrevus and had his last infusion in late November.  He has tolerated the infusions well.  He has not had any exacerbations or any definite new symptoms.  IgG/IgA/IgM were good when last checked at the end of last year.  He feels the left leg is weaker than the right leg but gait does well..    The leg rarely gives out but he is very active, working and exercising.    He has urinary hesitancy despite having some urge.  Rapaflo did not help more than Flomax.  He feel she does empty though sometimes does not and needs to go back shortly later.   Vision is doing ok but there is mild blurriness, sometimes at night.  This was felt to be due to mild astigmatism when he recently saw ophthalmology. He wears contacts or glasses.   He denies much fatigue.    He has a more normal schedule with his new job and sleep is better.  He denies any problems with depression or anxiety.    He denies problems with memory.     He recently changed jobs to a more physical job bit is doing well.   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  We discussed that he could be at a higher risk of contracting the SARS-CoV-2 virus and could have a more severe case of COVID-19 than other people his  age if he does get the virus because of the medication for his MS.    Observations/Objective: He is a well-developed well-nourished man in no acute distress.  The head is normocephalic and atraumatic.  Sclera are anicteric.  Visible skin appears normal.  The neck has a good range of motion.  Pharynx and tongue have normal appearance.  He is alert and fully oriented with fluent speech and good attention, knowledge and memory.  Extraocular muscles are intact.  Facial strength is normal.  Palatal elevation and tongue protrusion are midline.  He appears to have normal strength in the arms.  Rapid alternating movements and finger-nose-finger are performed well  Assessment and Plan: Multiple sclerosis (HCC)  History of optic neuritis  Urinary frequency  High risk medication use  Vitamin D deficiency  1.   He will continue Ocrevus infusions every 6 months.  His next infusion should be seen.  Advised him to call us back in a week if he does not hear any progress on scheduling his next infusion. 2.   Stay active and exercise as tolerated. 3.   Take vitamin D supplements daily 4.   Refer to urology if urinary symptoms worsen.   5.   He will return to see me in 6 months or sooner if there are new or worsening neurologic  symptoms.  Follow Up Instructions: I discussed the assessment and treatment plan with the patient. The patient was provided an opportunity to ask questions and all were answered. The patient agreed with the plan and demonstrated an understanding of the instructions.    The patient was advised to call back or seek an in-person evaluation if the symptoms worsen or if the condition fails to improve as anticipated.  I provided 20 minutes of non-face-to-face time during this encounter. ____________________________ From previous visits: Update 10/18/2017: He feels his MS is stable.   He is on Ocrevus and tolerated well.  Lab work at the last visit was normal.  His last MRI was about a  year and a half ago and we discussed repeating it to make sure that he is not having breakthrough disease.  He has occasional episodes of the left leg giving out, especially with multiple flights of stairs or with running.     Regular walking is good and he can easily walk 3 miles.    He denies weakness or numbness.    Bladder is about the same with some hesitancy. Vision is stable.    He notes mild fatigue intermittently.   He does ok in the heat as long as he take breaks and hydrates.    He sleeps ok and gets 7 hours or so.    Mood is good.  Cognition is fine.     Update 01/31/2017: He feels his MS is doing fairly well. He has had 3 courses of ocrelizumab, with the last one being in December.He denies any difficulty with new exacerbations. The infusions went well.  I reviewed the MRI dated 07/11/2016 and compared it to the last MRI.   He has lesions in the spinal cord, brain stem, cerebellum and the hemispheres.   However, none of them appear acute. Additionally, when compared to the 2016 MRI, there was no interval change.  He denies any major problems with his gait though balance is occasionally mildly off. He climbs ladders multiple times a day. He has not had any falls. He does note some numbness in the left hand and arm but not elsewhere. There is no pain or weakness in the limbs. He had optic neuritis in 2016 but he feels his vision returned to baseline.    Hie has had some diplopia after exercising and feels his balance was a little worse after playing basketball and working up a sweat.   He does note improvement in urinary frequency and has some nocturia. There was also some hesitancy but he did not get a benefit from Flomax.   He has some fatigue but this is usually manageable. He sleeps well though he typically will have nocturia once or twice.  He denies difficulties with mood or cognition.   From 05/14/2016: MS:   He switched from Gilenya to ocrelizumab in late 2017 due to a desire to start  a family and the possible risk of increased birth defects, even when the male is taking Gilenya.    His multiple sclerosis has been stable.  He has no exacerbations.  He tolerates it well. His last MRI 12/15/14 showed no new lesions.    He will be getting his next infusion soon (has not yet scheduled due to work requirements).  Gait/strength/sensation:   He walks more at his new job and walks on different surfaces.    He notes gait is slightly off .   No falls.  He can climb a ladder multiple  times..    He has mild left hand > arm numbness but no numbness elsewhere and no dysesthetic pain.     No hand weakness  Vision:   Vision is back to baseline after the ON in 2016. No reduced acuity or change in color vision.    He denies diplopia  Bladder:    He feels the urinary frequency is better and has nocturia x 2.    He reported some hesitancy but no incontinence.   Flomax did not help. He has mild constipation.    Fatigue/sleep:   He denies much fatigue and stays active.       He sleeps well most nights and has 1-2 x nocturia with quick return to sleep.    However, he sometimes works an additional shift and sleeps wrorse  Mood/cognition:   He denies any problems with depression or anxiety.   He has no cognitive issues.     MS History:   In December 2015 he had a several month history of dizziness, ataxic gait and hand numbness. MRI early December 2015 showed multiple white matter foci including several in the posterior fossa (left middle cerebellar peduncle, pons, medulla) and periventricular foci in both hemispheres. He also had a spine focus at C2. There were no acute lesions (no enhancement, no DWI brightness).Blood work showed normal or negative vasculitis labs, ACE, Lyme, HIV, RPR, pan ANCA, and anti-NMO antibody. Visual evoked potentials were abnormal showing prolongation of the P100 to about 130 ms on either side.  Lumbar puncture on 03/05/2014 showed abnormal CSF with > 5 oligoclonal bands  and elevated IgG index of 0.78.MRI of the brain 12/15/2014 was unchanged.    ROS:  Out of a complete 14 system review of symptoms, the patient complains only of the following symptoms, and all other reviewed systems are negative.  He notes difficulty with urinary hesitancy.   ALLERGIES: No Known Allergies  HOME MEDICATIONS:  Current Outpatient Medications:    amLODipine (NORVASC) 10 MG tablet, Take 10 mg by mouth daily., Disp: , Rfl:    Cholecalciferol (VITAMIN D PO), Take 5,000 Units by mouth daily., Disp: , Rfl:    OCREVUS 300 MG/10ML injection, INFUSE 600 MG INTRAVENOUSLY IN 500 ML OF NORMAL SALINE EVERY 6 MONTHS. INFUSE OVER 3.5 HOURS OR LONGER., Disp: 2 each, Rfl: 1 No current facility-administered medications for this visit.   Facility-Administered Medications Ordered in Other Visits:    gadopentetate dimeglumine (MAGNEVIST) injection 20 mL, 20 mL, Intravenous, Once PRN, Jarnell Cordaro, Pearletha Furl, MD  PAST MEDICAL HISTORY: Past Medical History:  Diagnosis Date   Carpal tunnel syndrome    Hypertension    Movement disorder    Multiple sclerosis (HCC)    Vision abnormalities     PAST SURGICAL HISTORY: Past Surgical History:  Procedure Laterality Date   MOUTH SURGERY  10 years ago    FAMILY HISTORY: Family History  Problem Relation Age of Onset   High blood pressure Mother    Cancer Maternal Grandmother    High blood pressure Father     SOCIAL HISTORY:  Social History   Socioeconomic History   Marital status: Married    Spouse name: Not on file   Number of children: 2   Years of education: Assoc   Highest education level: Not on file  Occupational History    Employer: OTHER    Comment: [employer]  Social Needs   Financial resource strain: Not on file   Food insecurity:    Worry:  Not on file    Inability: Not on file   Transportation needs:    Medical: Not on file    Non-medical: Not on file  Tobacco Use   Smoking status:  Never Smoker   Smokeless tobacco: Never Used  Substance and Sexual Activity   Alcohol use: Yes    Alcohol/week: 0.0 standard drinks    Comment: weekly   Drug use: No   Sexual activity: Not on file  Lifestyle   Physical activity:    Days per week: Not on file    Minutes per session: Not on file   Stress: Not on file  Relationships   Social connections:    Talks on phone: Not on file    Gets together: Not on file    Attends religious service: Not on file    Active member of club or organization: Not on file    Attends meetings of clubs or organizations: Not on file    Relationship status: Not on file   Intimate partner violence:    Fear of current or ex partner: Not on file    Emotionally abused: Not on file    Physically abused: Not on file    Forced sexual activity: Not on file  Other Topics Concern   Not on file  Social History Narrative   Patient lives at home with his family.   Caffeine use: occasionally   Patient has a college education    Patient has 2 children    Patient is right handed      PHYSICAL EXAM  There were no vitals filed for this visit.  There is no height or weight on file to calculate BMI.   General: The patient is well-developed and well-nourished and in no acute distress    Neurologic Exam  Mental status: The patient is alert and oriented x 3 at the time of the examination. The patient has apparent normal recent and remote memory, with an apparently normal attention span and concentration ability.   Speech is normal.  Cranial nerves: Extraocular movements are full.  There is a slight left APD.  Color vision is symmetric.  Facial strength and sensation is normal.  Trapezius strength is normal..  No dysarthria is noted. Hearing appears normal and symmetric.  Motor:  Muscle bulk is normal.   Tone is normal. Strength is  5 / 5 in all 4 extremities.   Coordination: Cerebellar testing shows good finger-nose-finger and heel-to-shin  bilaterally  Sensory: He reports symmetric sensation to touch and temperature in the arms and legs.  DTRs:   Reflexes are normal and symmetric in the arms.  Reflexes are increased in the legs but there is no cross abductor reflex at the knee or clonus at the ankles  Gait and station: Station is normal.  Both the gait and the tandem gait are normal.. Romberg is negative.Marland Kitchen      DIAGNOSTIC DATA (LABS, IMAGING, TESTING) - I reviewed patient records, labs, notes, testing and imaging myself where available.  Lab Results  Component Value Date   WBC 7.5 10/18/2017   HGB 14.6 10/18/2017   HCT 45.4 10/18/2017   MCV 80 10/18/2017   PLT 253 10/18/2017      Component Value Date/Time   NA 141 07/13/2015 0852   K 4.4 07/13/2015 0852   CL 100 07/13/2015 0852   CO2 22 07/13/2015 0852   GLUCOSE 100 (H) 07/13/2015 0852   BUN 12 07/13/2015 0852   CREATININE 1.06 07/13/2015 8882  CALCIUM 9.4 07/13/2015 0852   PROT 7.0 05/14/2016 1520   ALBUMIN 4.6 05/14/2016 1520   AST 33 05/14/2016 1520   ALT 29 05/14/2016 1520   ALKPHOS 68 05/14/2016 1520   BILITOT 0.4 05/14/2016 1520   GFRNONAA 95 07/13/2015 0852   GFRAA 110 07/13/2015 0852   No results found for: CHOL, HDL, LDLCALC, LDLDIRECT, TRIG, CHOLHDL No results found for: [id number] Lab Results  Component Value Date   VITAMINB12 469 12/22/2013   Lab Results  Component Value Date   TSH 2.300 12/22/2013       ASSESSMENT AND PLAN  Multiple sclerosis (HCC)  History of optic neuritis  Urinary frequency  High risk medication use  Vitamin D deficiency      Ediberto Sens A. Epimenio FootSater, MD, PhD 05/19/2018, 2:57 PM Certified in Neurology, Clinical Neurophysiology, Sleep Medicine, Pain Medicine and Neuroimaging  [facility] [address], [address] [place], [place] [id number] [phone]

## 2018-07-05 ENCOUNTER — Other Ambulatory Visit: Payer: Self-pay | Admitting: Neurology

## 2018-07-09 ENCOUNTER — Other Ambulatory Visit: Payer: Self-pay | Admitting: Neurology

## 2018-11-19 ENCOUNTER — Ambulatory Visit: Payer: Self-pay | Admitting: Neurology

## 2018-12-01 ENCOUNTER — Telehealth: Payer: Self-pay | Admitting: *Deleted

## 2018-12-01 NOTE — Telephone Encounter (Signed)
Tried calling pt at [phone]. Mailbox full, unable to LVM.   Called parents at [phone]. LVM for them to let them know we were trying to reach [place] and to have him call our office to schedule appt.

## 2018-12-04 NOTE — Telephone Encounter (Signed)
Checked and pt called phone staff and scheduled appt for 01/29/19 at 4pm with Dr. Felecia Shelling. I message infusion suite about f/u. Asked them to let me know if this appt is ok to keep or if sooner appt is needed. Waiting on response.

## 2019-01-29 ENCOUNTER — Ambulatory Visit: Payer: Commercial Managed Care - PPO | Admitting: Neurology

## 2019-01-29 ENCOUNTER — Encounter: Payer: Self-pay | Admitting: Neurology

## 2019-01-29 ENCOUNTER — Other Ambulatory Visit: Payer: Self-pay

## 2019-01-29 VITALS — BP 130/85 | HR 81 | Temp 97.5°F | Ht 69.0 in | Wt 228.0 lb

## 2019-01-29 DIAGNOSIS — R29898 Other symptoms and signs involving the musculoskeletal system: Secondary | ICD-10-CM | POA: Insufficient documentation

## 2019-01-29 DIAGNOSIS — G35 Multiple sclerosis: Secondary | ICD-10-CM

## 2019-01-29 DIAGNOSIS — E559 Vitamin D deficiency, unspecified: Secondary | ICD-10-CM

## 2019-01-29 DIAGNOSIS — Z8669 Personal history of other diseases of the nervous system and sense organs: Secondary | ICD-10-CM

## 2019-01-29 DIAGNOSIS — Z79899 Other long term (current) drug therapy: Secondary | ICD-10-CM

## 2019-01-29 DIAGNOSIS — R3911 Hesitancy of micturition: Secondary | ICD-10-CM | POA: Diagnosis not present

## 2019-01-29 MED ORDER — ALFUZOSIN HCL ER 10 MG PO TB24: 10.0000 mg | ORAL_TABLET | Freq: Every day | ORAL | 5 refills | Status: DC

## 2019-01-29 NOTE — Progress Notes (Signed)
[facility]  PATIENT: Lucas Berry DOB: [birth year]     HISTORICAL  CHIEF COMPLAINT:  Chief Complaint  Patient presents with  . Follow-up    RM 12, alone. Last seen 05/19/2018  . Multiple Sclerosis    On Ocrevus. Last infusion: around 09/2018. Next one: 02/10/2019.     HISTORY OF PRESENT ILLNESS:  Lucas Berry is a 34 y.o. 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     Update 01/29/2019: He is on Ocrevus and tolerates it well.    He notes his left leg sometimes gives out climbing chairs.   He is active at work and moves around heavy items.   He also exercises regularly.   Sometimes he feels lightheaded if he exercises more.    He notes very mild left leg weakness but no spasticity or numbness. He has some urinary hesitancy but did not get a benefit from Rapaflo or Flomax.   He has noted milddiplopia at times, usually if more tired.      His last MRI of the brain and cervical spine 12/19 showed no new lesions compared to the previous scan.  He has a 19 month old son and we discussed risks of him eventually being diagnosed with MS.  With the child's older they should make sure he gets enough vitamin D.  Update 05/19/2018 (virtual): He is on Ocrevus and had his last infusion in late November.  He has tolerated the infusions well.  He has not had any exacerbations or any definite new symptoms.  IgG/IgA/IgM were good when last checked at the end of last year.  He feels the left leg is weaker than the right leg but gait does well..    The leg rarely gives out but he is very active, working and exercising.    He has urinary hesitancy despite having some urge.  Rapaflo did not help more than Flomax.  He feel she does empty though sometimes does not and needs to go back shortly later.   Vision is doing ok but there is mild blurriness, sometimes at night.  This was felt to be due to mild astigmatism when he recently saw ophthalmology. He wears contacts or  glasses.   He denies much fatigue.    He has a more normal schedule with his new job and sleep is better.  He denies any problems with depression or anxiety.    He denies problems with memory.     He recently changed jobs to a more physical job bit is doing well.   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  We discussed that he could be at a higher risk of contracting the SARS-CoV-2 virus and could have a more severe case of COVID-19 than other people his age if he does get the virus because of the medication for his MS.  Update 10/18/2017: He feels his MS is stable.   He is on Ocrevus and tolerated well.  Lab work at the last visit was normal.  His last MRI was about a year and a half ago and we discussed repeating it to make sure that he is not having breakthrough disease.  He has occasional episodes of the left leg giving out, especially with multiple flights of stairs or with running.     Regular walking is good and he can easily walk 3 miles.    He denies weakness or numbness.  Bladder is about the same with some hesitancy. Vision is stable.    He notes mild fatigue intermittently.   He does ok in the heat as long as he take breaks and hydrates.    He sleeps ok and gets 7 hours or so.    Mood is good.  Cognition is fine.     Update 01/31/2017: He feels his MS is doing fairly well. He has had 3 courses of ocrelizumab, with the last one being in December.He denies any difficulty with new exacerbations. The infusions went well.  I reviewed the MRI dated 07/11/2016 and compared it to the last MRI.   He has lesions in the spinal cord, brain stem, cerebellum and the hemispheres.   However, none of them appear acute. Additionally, when compared to the 2016 MRI, there was no interval change.  He denies any major problems with his gait though balance is occasionally mildly off. He climbs ladders multiple times a day. He has not had any falls. He does note some  numbness in the left hand and arm but not elsewhere. There is no pain or weakness in the limbs. He had optic neuritis in 2016 but he feels his vision returned to baseline.    Hie has had some diplopia after exercising and feels his balance was a little worse after playing basketball and working up a sweat.   He does note improvement in urinary frequency and has some nocturia. There was also some hesitancy but he did not get a benefit from Flomax.   He has some fatigue but this is usually manageable. He sleeps well though he typically will have nocturia once or twice.  He denies difficulties with mood or cognition.   From 05/14/2016: MS:   He switched from Gilenya to ocrelizumab in late 2017 due to a desire to start a family and the possible risk of increased birth defects, even when the male is taking Gilenya.    His multiple sclerosis has been stable.  He has no exacerbations.  He tolerates it well. His last MRI 12/15/14 showed no new lesions.    He will be getting his next infusion soon (has not yet scheduled due to work requirements).  Gait/strength/sensation:   He walks more at his new job and walks on different surfaces.    He notes gait is slightly off .   No falls.  He can climb a ladder multiple times..    He has mild left hand > arm numbness but no numbness elsewhere and no dysesthetic pain.     No hand weakness  Vision:   Vision is back to baseline after the ON in 2016. No reduced acuity or change in color vision.    He denies diplopia  Bladder:    He feels the urinary frequency is better and has nocturia x 2.    He reported some hesitancy but no incontinence.   Flomax did not help. He has mild constipation.    Fatigue/sleep:   He denies much fatigue and stays active.       He sleeps well most nights and has 1-2 x nocturia with quick return to sleep.    However, he sometimes works an additional shift and sleeps wrorse  Mood/cognition:   He denies any problems with depression or anxiety.   He  has no cognitive issues.     MS History:   In December 2015 he had a several month history of dizziness, ataxic gait and hand numbness. MRI  early December 2015 showed multiple white matter foci including several in the posterior fossa (left middle cerebellar peduncle, pons, medulla) and periventricular foci in both hemispheres. He also had a spine focus at C2. There were no acute lesions (no enhancement, no DWI brightness).Blood work showed normal or negative vasculitis labs, ACE, Lyme, HIV, RPR, pan ANCA, and anti-NMO antibody. Visual evoked potentials were abnormal showing prolongation of the P100 to about 130 ms on either side.  Lumbar puncture on 03/05/2014 showed abnormal CSF with > 5 oligoclonal bands and elevated IgG index of 0.78.MRI of the brain 12/15/2014 was unchanged.    ROS:  Out of a complete 14 system review of symptoms, the patient complains only of the following symptoms, and all other reviewed systems are negative.  He notes difficulty with urinary hesitancy.   ALLERGIES: No Known Allergies  HOME MEDICATIONS:  Current Outpatient Medications:  .  amLODipine (NORVASC) 10 MG tablet, Take 10 mg by mouth daily., Disp: , Rfl:  .  Cholecalciferol (VITAMIN D PO), Take 5,000 Units by mouth daily., Disp: , Rfl:  .  OCREVUS 300 MG/10ML injection, INFUSE 600 MG INTRAVENOUSLY IN 500 ML OF NORMAL SALINE EVERY 6 MONTHS. INFUSE OVER 3.5 HOURS OR LONGER, Disp: 20 mL, Rfl: 1 No current facility-administered medications for this visit.  Facility-Administered Medications Ordered in Other Visits:  .  gadopentetate dimeglumine (MAGNEVIST) injection 20 mL, 20 mL, Intravenous, Once PRN, Gizel Riedlinger, Pearletha Furl, MD  PAST MEDICAL HISTORY: Past Medical History:  Diagnosis Date  . Carpal tunnel syndrome   . Hypertension   . Movement disorder   . Multiple sclerosis (HCC)   . Vision abnormalities     PAST SURGICAL HISTORY: Past Surgical History:  Procedure Laterality Date  . MOUTH  SURGERY  10 years ago    FAMILY HISTORY: Family History  Problem Relation Age of Onset  . High blood pressure Mother   . Cancer Maternal Grandmother   . High blood pressure Father     SOCIAL HISTORY:  Social History   Socioeconomic History  . Marital status: Married    Spouse name: Not on file  . Number of children: 2  . Years of education: Assoc  . Highest education level: Not on file  Occupational History    Employer: OTHER    Comment: [employer]  Tobacco Use  . Smoking status: Never Smoker  . Smokeless tobacco: Never Used  Substance and Sexual Activity  . Alcohol use: Yes    Alcohol/week: 0.0 standard drinks    Comment: weekly  . Drug use: No  . Sexual activity: Not on file  Other Topics Concern  . Not on file  Social History Narrative   Patient lives at home with his family.   Caffeine use: occasionally   Patient has a college education    Patient has 2 children    Patient is right handed    Social Determinants of Health   Financial Resource Strain:   . Difficulty of Paying Living Expenses: Not on file  Food Insecurity:   . Worried About Programme researcher, broadcasting/film/video in the Last Year: Not on file  . Ran Out of Food in the Last Year: Not on file  Transportation Needs:   . Lack of Transportation (Medical): Not on file  . Lack of Transportation (Non-Medical): Not on file  Physical Activity:   . Days of Exercise per Week: Not on file  . Minutes of Exercise per Session: Not on file  Stress:   .  Feeling of Stress : Not on file  Social Connections:   . Frequency of Communication with Friends and Family: Not on file  . Frequency of Social Gatherings with Friends and Family: Not on file  . Attends Religious Services: Not on file  . Active Member of Clubs or Organizations: Not on file  . Attends Archivist Meetings: Not on file  . Marital Status: Not on file  Intimate Partner Violence:   . Fear of Current or Ex-Partner: Not on file  . Emotionally  Abused: Not on file  . Physically Abused: Not on file  . Sexually Abused: Not on file     PHYSICAL EXAM  Vitals:   01/29/19 1541  BP: 130/85  Pulse: 81  Temp: (!) 97.5 F (36.4 C)  Weight: 228 lb (103.4 kg)  Height: 5\' 9"  (1.753 m)    Body mass index is 33.67 kg/m.   General: The patient is well-developed and well-nourished and in no acute distress    Neurologic Exam  Mental status: The patient is alert and oriented x 3 at the time of the examination. The patient has apparent normal recent and remote memory, with an apparently normal attention span and concentration ability.   Speech is normal.  Cranial nerves: Extraocular movements are full.  There is a slight left APD.  Color vision is symmetric.  Facial strength and sensation is normal.  Trapezius strength is normal..  No dysarthria is noted. Hearing appears normal and symmetric.  Motor:  Muscle bulk is normal.   Tone is normal. Strength is  5 / 5 in all 4 extremities on formal testing.functional testing he has very slight weakness in the left leg upon rising from a squat.   Coordination: Cerebellar testing shows good finger-nose-finger and heel-to-shin bilaterally  Sensory: He reports symmetric sensation to touch and temperature in the arms and legs.  DTRs:   Reflexes are normal and symmetric in the arms.  Reflexes are increased in the legs but there is no cross abductor reflex at the knee or clonus at the ankles  Gait and station: Station is normal.  Both the gait and the tandem gait are normal.. Romberg is negative.Marland Kitchen      DIAGNOSTIC DATA (LABS, IMAGING, TESTING) - I reviewed patient records, labs, notes, testing and imaging myself where available.  Lab Results  Component Value Date   WBC 7.5 10/18/2017   HGB 14.6 10/18/2017   HCT 45.4 10/18/2017   MCV 80 10/18/2017   PLT 253 10/18/2017      Component Value Date/Time   NA 141 07/13/2015 0852   K 4.4 07/13/2015 0852   CL 100 07/13/2015 0852   CO2 22  07/13/2015 0852   GLUCOSE 100 (H) 07/13/2015 0852   BUN 12 07/13/2015 0852   CREATININE 1.06 07/13/2015 0852   CALCIUM 9.4 07/13/2015 0852   PROT 7.0 05/14/2016 1520   ALBUMIN 4.6 05/14/2016 1520   AST 33 05/14/2016 1520   ALT 29 05/14/2016 1520   ALKPHOS 68 05/14/2016 1520   BILITOT 0.4 05/14/2016 1520   GFRNONAA 95 07/13/2015 0852   GFRAA 110 07/13/2015 0852   No results found for: CHOL, HDL, LDLCALC, LDLDIRECT, TRIG, CHOLHDL No results found for: HGBA1C Lab Results  Component Value Date   VITAMINB12 469 12/22/2013   Lab Results  Component Value Date   TSH 2.300 12/22/2013       ASSESSMENT AND PLAN  No diagnosis found.   1.  Continue Ocrevus.  We will check  blood work today. 2.  Later this year we will need to check an MRI of the brain to determine if there is any subclinical progression.  If present we may want to reconsider a different medication. 3.  I will have him try alfuzosin for his urinary hesitancy.  If this does not help, I be happy to refer him to urology if he wants. 4.  Return in 6 months or sooner if there are new or worsening neurologic symptoms.   Althia Egolf A. Epimenio Foot, MD, PhD 01/29/2019, 3:55 PM Certified in Neurology, Clinical Neurophysiology, Sleep Medicine, Pain Medicine and Neuroimaging  [facility] [address] [place], [place] [address] [phone]

## 2019-03-31 ENCOUNTER — Telehealth: Payer: Self-pay | Admitting: *Deleted

## 2019-03-31 NOTE — Telephone Encounter (Signed)
Gave completed/signed employee health and wellness form back to medical records to process for pt.

## 2019-04-02 ENCOUNTER — Telehealth: Payer: Self-pay | Admitting: *Deleted

## 2019-04-02 NOTE — Telephone Encounter (Signed)
Pt form @ the front desk for p/u. 

## 2019-04-02 NOTE — Telephone Encounter (Signed)
Gave completed/signed FMLA for back to medical records to process for pt.

## 2019-04-08 ENCOUNTER — Telehealth: Payer: Self-pay | Admitting: Neurology

## 2019-04-08 NOTE — Telephone Encounter (Signed)
Pt is going to [facility] soon and will be going to a Theme park.  Pt would like to know if he is able to ride Theme Park rides, please call

## 2019-04-08 NOTE — Telephone Encounter (Signed)
Called pt and LVM (ok per DPR). Advised per Dr. Epimenio Foot that he is okay to go to theme parks/ride rides. Needs to use caution for rides that may make him dizzy. Also needs to use caution in heat with his MS. Asked him to call back if he has any further questions.

## 2019-07-29 ENCOUNTER — Ambulatory Visit: Payer: Commercial Managed Care - PPO | Admitting: Neurology

## 2019-07-29 ENCOUNTER — Encounter: Payer: Self-pay | Admitting: Neurology

## 2019-07-29 ENCOUNTER — Other Ambulatory Visit: Payer: Self-pay

## 2019-07-29 VITALS — BP 142/89 | HR 73 | Ht 69.0 in | Wt 221.5 lb

## 2019-07-29 DIAGNOSIS — G35 Multiple sclerosis: Secondary | ICD-10-CM | POA: Diagnosis not present

## 2019-07-29 DIAGNOSIS — R3911 Hesitancy of micturition: Secondary | ICD-10-CM | POA: Diagnosis not present

## 2019-07-29 DIAGNOSIS — Z8669 Personal history of other diseases of the nervous system and sense organs: Secondary | ICD-10-CM | POA: Diagnosis not present

## 2019-07-29 DIAGNOSIS — R29898 Other symptoms and signs involving the musculoskeletal system: Secondary | ICD-10-CM

## 2019-07-29 DIAGNOSIS — Z79899 Other long term (current) drug therapy: Secondary | ICD-10-CM | POA: Diagnosis not present

## 2019-07-29 DIAGNOSIS — E559 Vitamin D deficiency, unspecified: Secondary | ICD-10-CM

## 2019-07-29 NOTE — Progress Notes (Signed)
[facility]  PATIENT: Lucas Berry DOB: 1985-02-01     HISTORICAL  CHIEF COMPLAINT:  Chief Complaint  Patient presents with  . Follow-up    RM 12, alone. Last seen 01/29/2019. Pt reports he has double vision intermittently. Notices it more towards the end of the work day.   . Multiple Sclerosis    On Ocrevus. Last: 02/11/19. Next: 08/26/19. Receives at [facility]. Tolerating well, no issues.     HISTORY OF PRESENT ILLNESS:  Lucas Berry is a 34 y.o. 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     Update 07/29/2019: He is on Ocrevus.  He tolerates it well.   He is noting a little more intermittent diplopia when he is tired.  He is walking well.   He walks a lot at work and also does stairs .  Sometimes he will hold the bannister.   He notes his left leg sometimes gives out at times.  He walks a few miles daily at work.   He also exercises regularly.     He notes very mild left leg weakness but no spasticity or numbness. He has some urinary hesitancy but did not get much of any benefit from Rapaflo, Alfuzoson or Flomax.   He occasionally has lightheadedness, usually if exerting more.   Visual acuity is the same each eye.    His last MRI of the brain and cervical spine 12/19 showed no new lesions compared to the previous scan.     MS History:   In December 2015 he had a several month history of dizziness, ataxic gait and hand numbness. MRI early December 2015 showed multiple white matter foci including several in the posterior fossa (left middle cerebellar peduncle, pons, medulla) and periventricular foci in both hemispheres. He also had a spine focus at C2. There were no acute lesions (no enhancement, no DWI brightness).Blood work showed normal or negative vasculitis labs, ACE, Lyme, HIV, RPR, pan ANCA, and anti-NMO antibody. Visual evoked potentials were abnormal showing prolongation of the P100 to about 130 ms on either side.  Lumbar puncture on  03/05/2014 showed abnormal CSF with > 5 oligoclonal bands and elevated IgG index of 0.78.MRI of the brain 12/15/2014 was unchanged.  He started Gilenya after diagnosis.  He did well with it.  However, he switched ocrelizumab in late 2017 due to a desire to start a family and the possible risk of increased birth defects, even when the male is taking Gilenya.    MRI of the cervical spine 12/04/2017 shows multiple T2 hyperintense foci within the spinal cord located just below the cervicomedullary junction, adjacent to C2 to the left, posteriorly adjacent to C3, posteriorly to the right adjacent to C3-C4, posteriorly adjacent to C4-C5, laterally to the right adjacent to C6, posteriorly adjacent to C7 and lateral to the left adjacent to T1.   None of these appear to be acute and none of them enhance after contrast..  MRI of the brain 12/04/2017 shows foci within the spinal cord adjacent to C2 and C3. There are several small T2/FLAIR hyperintense foci in the cerebellar hemispheres.  Foci are noted with in the medulla, left middle cerebellar peduncle, pons, and bilaterally in the thalamus.  In the hemispheres, there are multiple T2/FLAIR hyperintense foci in the periventricular, juxtacortical and deep white matter.Many of the periventricular foci are radially oriented to the ventricles. Some foci are hypointense on T1-weighted images. There were no enhancing lesions. When compared to the MRI dated  07/11/2016, there are no new lesions.  ROS:  Out of a complete 14 system review of symptoms, the patient complains only of the following symptoms, and all other reviewed systems are negative.  He notes difficulty with urinary hesitancy.   ALLERGIES: No Known Allergies  HOME MEDICATIONS:  Current Outpatient Medications:  .  alfuzosin (UROXATRAL) 10 MG 24 hr tablet, Take 1 tablet (10 mg total) by mouth daily with breakfast., Disp: 30 tablet, Rfl: 5 .  amLODipine (NORVASC) 10 MG tablet, Take 10 mg by mouth  daily., Disp: , Rfl:  .  Cholecalciferol (VITAMIN D PO), Take 5,000 Units by mouth daily., Disp: , Rfl:  .  OCREVUS 300 MG/10ML injection, INFUSE 600 MG INTRAVENOUSLY IN 500 ML OF NORMAL SALINE EVERY 6 MONTHS. INFUSE OVER 3.5 HOURS OR LONGER, Disp: 20 mL, Rfl: 1 No current facility-administered medications for this visit.  Facility-Administered Medications Ordered in Other Visits:  .  gadopentetate dimeglumine (MAGNEVIST) injection 20 mL, 20 mL, Intravenous, Once PRN, Kristine Tiley, Lucas Furl, MD  PAST MEDICAL HISTORY: Past Medical History:  Diagnosis Date  . Carpal tunnel syndrome   . Hypertension   . Movement disorder   . Multiple sclerosis (HCC)   . Vision abnormalities     PAST SURGICAL HISTORY: Past Surgical History:  Procedure Laterality Date  . MOUTH SURGERY  10 years ago    FAMILY HISTORY: Family History  Problem Relation Age of Onset  . High blood pressure Mother   . Cancer Maternal Grandmother   . High blood pressure Father     SOCIAL HISTORY:  Social History   Socioeconomic History  . Marital status: Married    Spouse name: Not on file  . Number of children: 2  . Years of education: Assoc  . Highest education level: Not on file  Occupational History    Employer: OTHER    Comment: [employer]  Tobacco Use  . Smoking status: Never Smoker  . Smokeless tobacco: Never Used  Substance and Sexual Activity  . Alcohol use: Yes    Alcohol/week: 0.0 standard drinks    Comment: weekly  . Drug use: No  . Sexual activity: Not on file  Other Topics Concern  . Not on file  Social History Narrative   Patient lives at home with his family.   Caffeine use: occasionally   Patient has a college education    Patient has 2 children    Patient is right handed    Social Determinants of Health   Financial Resource Strain:   . Difficulty of Paying Living Expenses:   Food Insecurity:   . Worried About Programme researcher, broadcasting/film/video in the Last Year:   . Barista in the  Last Year:   Transportation Needs:   . Freight forwarder (Medical):   Marland Kitchen Lack of Transportation (Non-Medical):   Physical Activity:   . Days of Exercise per Week:   . Minutes of Exercise per Session:   Stress:   . Feeling of Stress :   Social Connections:   . Frequency of Communication with Friends and Family:   . Frequency of Social Gatherings with Friends and Family:   . Attends Religious Services:   . Active Member of Clubs or Organizations:   . Attends Banker Meetings:   Marland Kitchen Marital Status:   Intimate Partner Violence:   . Fear of Current or Ex-Partner:   . Emotionally Abused:   Marland Kitchen Physically Abused:   . Sexually Abused:  PHYSICAL EXAM  Vitals:   07/29/19 1518  BP: (!) 142/89  Pulse: 73  Weight: (!) 221 lb 8 oz (100.5 kg)  Height: 5\' 9"  (1.753 m)    Body mass index is 32.71 kg/m.   General: The patient is well-developed and well-nourished and in no acute distress    Neurologic Exam  Mental status: The patient is alert and oriented x 3 at the time of the examination. The patient has apparent normal recent and remote memory, with an apparently normal attention span and concentration ability.   Speech is normal.  Cranial nerves: Extraocular movements are full.  There is a slight left APD.  Color vision is symmetric.  Facial strength and sensation is normal.  Trapezius strength is normal..  No dysarthria is noted. Hearing appears normal and symmetric.  Motor:  Muscle bulk is normal.   Tone is normal. Strength is  5 / 5 in all 4 extremities on formal testing.functional testing he has very slight weakness in the left leg upon rising from a squat.   Coordination: Cerebellar testing shows good finger-nose-finger and heel-to-shin bilaterally  Sensory: He reports symmetric sensation to touch and temperature in the arms and legs.  DTRs:   Deep tendon reflexes are normal in the arms, 3+ at the knees and ankles.  Gait and station: Station is normal.   Both the gait and the tandem gait are normal.. Romberg is negative..       ASSESSMENT AND PLAN  Multiple sclerosis (HCC) - Plan: CBC with Differential/Platelet, IgG, IgA, IgM, MR BRAIN W WO CONTRAST  High risk medication use - Plan: CBC with Differential/Platelet, IgG, IgA, IgM  Urinary hesitancy  History of optic neuritis  Vitamin D deficiency  Left leg weakness   1.  Continue Ocrevus.  We will check blood work today.  Check MRI of the brain to determine if there is any subclinical progression. If present we may want to reconsider a different medication. 2.   Stay active and exercise as tolerated. 3.  If bladder issues worsen refer to urology.  He has not had a benefit from Flomax, Rapaflo or Uroxatrol for his urinary hesitancy 4.  Return in 6 months or sooner if there are new or worsening neurologic symptoms.   Higinio Grow A. , MD, PhD 07/29/2019, 6:28 PM Certified in Neurology, Clinical Neurophysiology, Sleep Medicine, Pain Medicine and Neuroimaging  [facility] [address] [place], [place] [phone]

## 2019-07-30 LAB — CBC WITH DIFFERENTIAL/PLATELET
Basophils Absolute: 0 10*3/uL (ref 0.0–0.2)
Basos: 0 %
EOS (ABSOLUTE): 0 10*3/uL (ref 0.0–0.4)
Eos: 0 %
Hematocrit: 43 % (ref 37.5–51.0)
Hemoglobin: 14.7 g/dL (ref 13.0–17.7)
Immature Grans (Abs): 0 10*3/uL (ref 0.0–0.1)
Immature Granulocytes: 0 %
Lymphocytes Absolute: 1.3 10*3/uL (ref 0.7–3.1)
Lymphs: 28 %
MCH: 27 pg (ref 26.6–33.0)
MCHC: 34.2 g/dL (ref 31.5–35.7)
MCV: 79 fL (ref 79–97)
Monocytes Absolute: 0.4 10*3/uL (ref 0.1–0.9)
Monocytes: 8 %
Neutrophils Absolute: 2.9 10*3/uL (ref 1.4–7.0)
Neutrophils: 64 %
Platelets: 281 10*3/uL (ref 150–450)
RBC: 5.44 x10E6/uL (ref 4.14–5.80)
RDW: 13 % (ref 11.6–15.4)
WBC: 4.6 10*3/uL (ref 3.4–10.8)

## 2019-07-30 LAB — IGG, IGA, IGM
IgA/Immunoglobulin A, Serum: 133 mg/dL (ref 90–386)
IgG (Immunoglobin G), Serum: 962 mg/dL (ref 603–1613)
IgM (Immunoglobulin M), Srm: 36 mg/dL (ref 20–172)

## 2019-11-30 ENCOUNTER — Telehealth: Payer: Self-pay | Admitting: Neurology

## 2019-11-30 NOTE — Telephone Encounter (Signed)
MR Brain w/wo contrast Dr. Trilby Leaver Auth: [id number] (exp. 12/09/19 to 01/08/20). Patient is scheduled at [facility] for 12/09/19.

## 2019-12-09 ENCOUNTER — Other Ambulatory Visit: Payer: Self-pay

## 2019-12-09 ENCOUNTER — Ambulatory Visit (INDEPENDENT_AMBULATORY_CARE_PROVIDER_SITE_OTHER): Payer: Commercial Managed Care - PPO

## 2019-12-09 DIAGNOSIS — G35 Multiple sclerosis: Secondary | ICD-10-CM

## 2019-12-09 MED ORDER — GADOBENATE DIMEGLUMINE 529 MG/ML IV SOLN
20.0000 mL | Freq: Once | INTRAVENOUS | Status: AC | PRN
  Administered 2019-12-09: 20 mL via INTRAVENOUS

## 2019-12-31 ENCOUNTER — Telehealth: Payer: Self-pay | Admitting: Neurology

## 2019-12-31 NOTE — Telephone Encounter (Signed)
I spoke to patient and made him aware that MRI of the brain looked ok. Pt voiced appreciation.

## 2019-12-31 NOTE — Telephone Encounter (Signed)
Pt called and LVM wanting to know if his MRI results have come in yet. Please advise.

## 2019-12-31 NOTE — Telephone Encounter (Signed)
LVM informing patient Dr Epimenio Foot stated the MRI of the brain looks good. There are no new lesions. Left # for questions, advised office is now closed for holiday.

## 2019-12-31 NOTE — Telephone Encounter (Signed)
pls see result note from Dr. Epimenio Foot.

## 2020-02-03 ENCOUNTER — Other Ambulatory Visit: Payer: Self-pay

## 2020-02-03 ENCOUNTER — Ambulatory Visit: Payer: Commercial Managed Care - PPO | Admitting: Neurology

## 2020-02-03 ENCOUNTER — Encounter: Payer: Self-pay | Admitting: Neurology

## 2020-02-03 VITALS — BP 144/82 | HR 80 | Ht 69.0 in | Wt 233.5 lb

## 2020-02-03 DIAGNOSIS — H532 Diplopia: Secondary | ICD-10-CM

## 2020-02-03 DIAGNOSIS — E559 Vitamin D deficiency, unspecified: Secondary | ICD-10-CM

## 2020-02-03 DIAGNOSIS — G35 Multiple sclerosis: Secondary | ICD-10-CM

## 2020-02-03 DIAGNOSIS — Z79899 Other long term (current) drug therapy: Secondary | ICD-10-CM | POA: Diagnosis not present

## 2020-02-03 DIAGNOSIS — R3911 Hesitancy of micturition: Secondary | ICD-10-CM

## 2020-02-03 NOTE — Progress Notes (Addendum)
[facility]  PATIENT: Lucas Berry DOB: 01-28-1985     HISTORICAL  CHIEF COMPLAINT:  Chief Complaint  Patient presents with  . Follow-up    RM 12, alone. Last seen 07/29/2019. On Ocrevus for MS. Last infusion: 09/02/2019. Next: 03/15/20. Tolerating well, no issues. Denies any new sx.    HISTORY OF PRESENT ILLNESS:  Lucas Berry is a 35 y.o. 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     Update 02/03/2020: He is on Ocrevus.  He tolerates it well.   No exacerbations or new neurologic symptoms.    He has had some diplopia and it may be slightly worse if he is tired   Actual acuity is fine.  He is walking well.   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  Sometimes he will hold the bannister.   He notes his left leg sometimes gives out at times.  He walks a few miles daily at work.   He also exercises regularly.     He notes very mild left leg weakness but no spasticity or numbness.   He has some urinary hesitancy but did not get much of any benefit from Rapaflo, Alfuzoson or Flomax.   He occasionally has lightheadedness, usually if exerting more.    His last MRI of the brain and cervical spine 12/19 showed no new lesions compared to the previous scan.   He feels more sluggish.   He sleeps well most nights.   Weight is stable.   He snores. Wife has not told him he has OSA signs.   He is sometimes sleepy during the day.  EPWORTH SLEEPINESS SCALE  On a scale of 0 - 3 what is the chance of dozing:  Sitting and Reading:   1 Watching TV:    2 Sitting inactive in a public place: 1 Passenger in car for one hour: 1 Lying down to rest in the afternoon: 2 Sitting and talking to someone: 0 Sitting quietly after lunch:  0 In a car, stopped in traffic:  0  Total (out of 24):    7/24 (normal)   MS History:   In December 2015 he had a several month history of dizziness, ataxic gait and hand numbness.  MRI early December 2015 showed multiple white matter foci including several in the posterior fossa (left middle cerebellar peduncle, pons, medulla) and periventricular foci in both hemispheres. He also had a spine focus at C2. There were no acute lesions (no enhancement, no DWI brightness).Blood work showed normal or negative vasculitis labs, ACE, Lyme, HIV, RPR, pan ANCA, and anti-NMO antibody. Visual evoked potentials were abnormal showing prolongation of the P100 to about 130 ms on either side.  Lumbar puncture on 03/05/2014 showed abnormal CSF with > 5 oligoclonal bands and elevated IgG index of 0.78.MRI of the brain 12/15/2014 was unchanged.  He started Gilenya after diagnosis.  He did well with it.  However, he switched ocrelizumab in late 2017 due to a desire to start a family and the possible risk of increased birth defects, even when the male is taking Gilenya.    MRI of the cervical spine 12/04/2017 shows multiple T2 hyperintense foci within the spinal cord located just below the cervicomedullary junction, adjacent to C2 to the left, posteriorly adjacent to C3, posteriorly to the right adjacent to C3-C4, posteriorly adjacent to C4-C5, laterally to the right adjacent to C6, posteriorly adjacent to  C7 and lateral to the left adjacent to T1.   None of these appear to be acute and none of them enhance after contrast..  MRI of the brain 12/04/2017 shows foci within the spinal cord adjacent to C2 and C3. There are several small T2/FLAIR hyperintense foci in the cerebellar hemispheres.  Foci are noted with in the medulla, left middle cerebellar peduncle, pons, and bilaterally in the thalamus.  In the hemispheres, there are multiple T2/FLAIR hyperintense foci in the periventricular, juxtacortical and deep white matter.Many of the periventricular foci are radially oriented to the ventricles. Some foci are hypointense on T1-weighted images. There were no enhancing lesions. When compared to the MRI dated  07/11/2016, there are no new lesions.  MRI of the brain 12/09/2019 showed T2/flair hyperintense foci in the hemispheres, brainstem, thalamus, left middle cerebellar peduncle and spinal cord in a pattern and configuration consistent with chronic demyelinating plaque associated with multiple sclerosis.  None of the foci enhances or appears to be acute.  Compared to the MRI dated 12/04/2017, there do not appear to be any new lesions.   .   Chronic sinusitis involving the left maxillary, ethmoid and frontal sinuses.  ROS: Out of a complete 14 system review of symptoms, the patient complains only of the following symptoms, and all other reviewed systems are negative.  He notes difficulty with urinary hesitancy.  He has fatigue   ALLERGIES: No Known Allergies  HOME MEDICATIONS:  Current Outpatient Medications:  .  alfuzosin (UROXATRAL) 10 MG 24 hr tablet, Take 1 tablet (10 mg total) by mouth daily with breakfast., Disp: 30 tablet, Rfl: 5 .  amLODipine (NORVASC) 10 MG tablet, Take 10 mg by mouth daily., Disp: , Rfl:  .  Cholecalciferol (VITAMIN D PO), Take 5,000 Units by mouth daily., Disp: , Rfl:  .  OCREVUS 300 MG/10ML injection, INFUSE 600 MG INTRAVENOUSLY IN 500 ML OF NORMAL SALINE EVERY 6 MONTHS. INFUSE OVER 3.5 HOURS OR LONGER, Disp: 20 mL, Rfl: 1 No current facility-administered medications for this visit.  Facility-Administered Medications Ordered in Other Visits:  .  gadopentetate dimeglumine (MAGNEVIST) injection 20 mL, 20 mL, Intravenous, Once PRN, Sater, Pearletha Furl, MD  PAST MEDICAL HISTORY: Past Medical History:  Diagnosis Date  . Carpal tunnel syndrome   . Hypertension   . Movement disorder   . Multiple sclerosis (HCC)   . Vision abnormalities     PAST SURGICAL HISTORY: Past Surgical History:  Procedure Laterality Date  . MOUTH SURGERY  10 years ago    FAMILY HISTORY: Family History  Problem Relation Age of Onset  . High blood pressure Mother   . Cancer Maternal  Grandmother   . High blood pressure Father     SOCIAL HISTORY:  Social History   Socioeconomic History  . Marital status: Married    Spouse name: Not on file  . Number of children: 2  . Years of education: Assoc  . Highest education level: Not on file  Occupational History    Employer: OTHER    Comment: [employer]  Tobacco Use  . Smoking status: Never Smoker  . Smokeless tobacco: Never Used  Substance and Sexual Activity  . Alcohol use: Yes    Alcohol/week: 0.0 standard drinks    Comment: weekly  . Drug use: No  . Sexual activity: Not on file  Other Topics Concern  . Not on file  Social History Narrative   Patient lives at home with his family.   Caffeine use: occasionally  Patient has a college education    Patient has 2 children    Patient is right handed    Social Determinants of Health   Financial Resource Strain: Not on file  Food Insecurity: Not on file  Transportation Needs: Not on file  Physical Activity: Not on file  Stress: Not on file  Social Connections: Not on file  Intimate Partner Violence: Not on file     PHYSICAL EXAM  Vitals:   02/03/20 1521  BP: (!) 144/82  Pulse: 80  Weight: 233 lb 8 oz (105.9 kg)  Height: 5\' 9"  (1.753 m)    Body mass index is 34.48 kg/m.   General: The patient is well-developed and well-nourished and in no acute distress    Neurologic Exam  Mental status: The patient is alert and oriented x 3 at the time of the examination. The patient has apparent normal recent and remote memory, with an apparently normal attention span and concentration ability.   Speech is normal.  Cranial nerves: Extraocular movements are full but mild nystagmus to left gaze.  There is a slight left APD.  Color vision is symmetric.  Facial strength and sensation is normal.  Trapezius strength is normal..  No dysarthria is noted. Hearing appears normal and symmetric.  Motor:  Muscle bulk is normal.   Tone is normal. Strength is  5 /  5 in all 4 extremities on formal testing.functional testing he has very slight weakness in the left leg upon rising from a squat.   Coordination: Cerebellar testing shows good finger-nose-finger and heel-to-shin bilaterally  Sensory: He reports symmetric sensation to touch and temperature in the arms and legs.  DTRs:   Deep tendon reflexes are normal in the arms, 3+ at the knees and ankles.  Gait and station: Station is normal.  Both the gait and the tandem gait are normal.. Romberg is negative..       ASSESSMENT AND PLAN  Multiple sclerosis (HCC)  High risk medication use  Urinary hesitancy  Diplopia  Vitamin D deficiency   1.  Continue Ocrevus.  Labs were fine last time so will check IgG/IgM and CBC at next visit 2.  Stay active and exercise as tolerated.   Careful on stairs or ladder.   3.  If bladder issues worsen refer to urology.  He has not had a benefit from Flomax, Rapaflo or Uroxatrol for his urinary hesitancy 4.  Continue Vit D OTC supp's 5.  Has snoring and fatigue but not sleepiness. Return in 6 months or sooner if there are new or worsening neurologic symptoms.   Richard A. , MD, PhD 02/03/2020, 3:48 PM Certified in Neurology, Clinical Neurophysiology, Sleep Medicine, Pain Medicine and Neuroimaging  [facility] [address] [place], [place] [phone]

## 2020-08-03 ENCOUNTER — Ambulatory Visit: Payer: Commercial Managed Care - PPO | Admitting: Family Medicine

## 2020-08-08 ENCOUNTER — Encounter: Payer: Commercial Managed Care - PPO | Admitting: Medical

## 2020-08-19 ENCOUNTER — Ambulatory Visit: Payer: Commercial Managed Care - PPO | Admitting: Medical

## 2020-08-19 ENCOUNTER — Other Ambulatory Visit: Payer: Self-pay

## 2020-08-19 VITALS — BP 130/90 | HR 71 | Ht 69.5 in | Wt 226.6 lb

## 2020-08-19 DIAGNOSIS — Z0000 Encounter for general adult medical examination without abnormal findings: Secondary | ICD-10-CM

## 2020-08-19 DIAGNOSIS — Z13220 Encounter for screening for lipoid disorders: Secondary | ICD-10-CM

## 2020-08-19 DIAGNOSIS — G35 Multiple sclerosis: Secondary | ICD-10-CM

## 2020-08-19 DIAGNOSIS — Z23 Encounter for immunization: Secondary | ICD-10-CM | POA: Diagnosis not present

## 2020-08-19 DIAGNOSIS — Z131 Encounter for screening for diabetes mellitus: Secondary | ICD-10-CM

## 2020-08-19 NOTE — Progress Notes (Signed)
Subjective:   HPI  Lucas Berry is a 35 y.o. male who presents for Chief Complaint  Patient presents with   new pt    New pt cpe. No concerns    Patient Care Team: Deatra James, MD as PCP - General (Family Medicine) prior Sees dentist Sees eye doctor Dr. Despina Arias, neurology  Concerns: New patient today to establish care  Was seeing [facility] physicians in the past.  Sees neurology regularly for history of MS.  Diagnosed with high blood pressure within the last 2 years.  Has been on amlodipine 10 mg initially then brought down to 5 mg.  Not currently taking this as blood pressures at the local pharmacy have been looking normal.  No current symptoms of concern  He has had COVID vaccination.  Reviewed their medical, surgical, family, social, medication, and allergy history and updated chart as appropriate.  Past Medical History:  Diagnosis Date   Carpal tunnel syndrome    Hypertension    Movement disorder    Multiple sclerosis (HCC)    Vision abnormalities     Past Surgical History:  Procedure Laterality Date   MOUTH SURGERY  10 years ago    Family History  Problem Relation Age of Onset   High blood pressure Mother    Cancer Maternal Grandmother    High blood pressure Father      Current Outpatient Medications:    Cholecalciferol (VITAMIN D PO), Take 5,000 Units by mouth daily., Disp: , Rfl:    multivitamin (PROSIGHT) TABS tablet, Take 1 tablet by mouth daily., Disp: , Rfl:    OCREVUS 300 MG/10ML injection, INFUSE 600 MG INTRAVENOUSLY IN 500 ML OF NORMAL SALINE EVERY 6 MONTHS. INFUSE OVER 3.5 HOURS OR LONGER, Disp: 20 mL, Rfl: 1 No current facility-administered medications for this visit.  Facility-Administered Medications Ordered in Other Visits:    gadopentetate dimeglumine (MAGNEVIST) injection 20 mL, 20 mL, Intravenous, Once PRN, Sater, Pearletha Furl, MD  No Known Allergies   Review of Systems Constitutional: -fever, -chills, -sweats, -unexpected weight  change, -decreased appetite, -fatigue Allergy: -sneezing, -itching, -congestion Dermatology: -changing moles, --rash, -lumps ENT: -runny nose, -ear pain, -sore throat, -hoarseness, -sinus pain, -teeth pain, - ringing in ears, -hearing loss, -nosebleeds Cardiology: -chest pain, -palpitations, -swelling, -difficulty breathing when lying flat, -waking up short of breath Respiratory: -cough, -shortness of breath, -difficulty breathing with exercise or exertion, -wheezing, -coughing up blood Gastroenterology: -abdominal pain, -nausea, -vomiting, -diarrhea, -constipation, +rare blood in stool, -changes in bowel movement, -difficulty swallowing or eating Hematology: -bleeding, -bruising  Musculoskeletal: -joint aches, -muscle aches, -joint swelling, -back pain, -neck pain, -cramping, -changes in gait Ophthalmology: denies vision changes, eye redness, itching, discharge Urology: -burning with urination, -difficulty urinating, -blood in urine, -urinary frequency, -urgency, -incontinence Neurology: -headache, -weakness, -tingling, -numbness, -memory loss, -falls, -dizziness Psychology: -depressed mood, -agitation, -sleep problems Male GU: no testicular mass, pain, no lymph nodes swollen, no swelling, no rash.   Depression screen [facility] 2/9 08/19/2020 11/17/2014  Decreased Interest 0 0  Down, Depressed, Hopeless 0 0  PHQ - 2 Score 0 0        Objective:  BP 130/90   Pulse 71   Ht 5' 9.5" (1.765 m)   Wt 226 lb 9.6 oz (102.8 kg)   SpO2 99%   BMI 32.98 kg/m   General appearance: alert, no distress, WD/WN, African American male Skin: unremarkable HEENT: normocephalic, conjunctiva/corneas normal, sclerae anicteric, PERRLA, EOMi, nares patent, no discharge or erythema, pharynx normal Neck: supple, no lymphadenopathy,  no thyromegaly, no masses, normal ROM, no bruits Chest: non tender, normal shape and expansion Heart: RRR, normal S1, S2, no murmurs Lungs: CTA bilaterally, no wheezes, rhonchi, or  rales Abdomen: +bs, soft, non tender, non distended, no masses, no hepatomegaly, no splenomegaly, no bruits Back: non tender, normal ROM, no scoliosis Musculoskeletal: upper extremities non tender, no obvious deformity, normal ROM throughout, lower extremities non tender, no obvious deformity, normal ROM throughout Extremities: no edema, no cyanosis, no clubbing Pulses: 2+ symmetric, upper and lower extremities, normal cap refill Neurological: alert, oriented x 3, CN2-12 intact, strength normal upper extremities and lower extremities, sensation normal throughout, DTRs 2+ throughout, no cerebellar signs, gait normal Psychiatric: normal affect, behavior normal, pleasant  GU: normal male external genitalia,circumcised, nontender, no masses, no hernia, no lymphadenopathy Rectal: deferred   Assessment and Plan :   Encounter Diagnoses  Name Primary?   Encounter for health maintenance examination in adult Yes   Need for Tdap vaccination    Screening for lipid disorders    Multiple sclerosis (HCC)    Screening for diabetes mellitus     This visit was a preventative care visit, also known as wellness visit or routine physical.   Topics typically include healthy lifestyle, diet, exercise, preventative care, vaccinations, sick and well care, proper use of emergency dept and after hours care, as well as other concerns.     Recommendations: Continue to return yearly for your annual wellness and preventative care visits.  This gives [place] a chance to discuss healthy lifestyle, exercise, vaccinations, review your chart record, and perform screenings where appropriate.  I recommend you see your eye doctor yearly for routine vision care.  I recommend you see your dentist yearly for routine dental care including hygiene visits twice yearly.   Vaccination recommendations were reviewed Immunization History  Administered Date(s) Administered   Tdap 08/19/2020   Counseled on the Tdap (tetanus,  diptheria, and acellular pertussis) vaccine.  Vaccine information sheet given. Tdap vaccine given after consent obtained.  I recommend a yearly flu shot in the fall  Get [place] a copy of your covid vaccine records   Screening for cancer: Colon cancer screening: Age 65  Testicular cancer screening You should do a monthly self testicular exam if you are between 62-65 years old  We discussed PSA, prostate exam, and prostate cancer screening risks/benefits.   Age 60  Skin cancer screening: Check your skin regularly for new changes, growing lesions, or other lesions of concern Come in for evaluation if you have skin lesions of concern.  Lung cancer screening: If you have a greater than 20 pack year history of tobacco use, then you may qualify for lung cancer screening with a chest CT scan.   Please call your insurance company to inquire about coverage for this test.  We currently don't have screenings for other cancers besides breast, cervical, colon, and lung cancers.  If you have a strong family history of cancer or have other cancer screening concerns, please let me know.    Bone health: Get at least 150 minutes of aerobic exercise weekly Get weight bearing exercise at least once weekly Bone density test:  A bone density test is an imaging test that uses a type of X-ray to measure the amount of calcium and other minerals in your bones. The test may be used to diagnose or screen you for a condition that causes weak or thin bones (osteoporosis), predict your risk for a broken bone (fracture), or determine how well  your osteoporosis treatment is working. The bone density test is recommended for females 65 and older, or females or males <65 if certain risk factors such as thyroid disease, long term use of steroids such as for asthma or rheumatological issues, vitamin D deficiency, estrogen deficiency, family history of osteoporosis, self or family history of fragility fracture in first degree  relative.    Heart health: Get at least 150 minutes of aerobic exercise weekly Limit alcohol It is important to maintain a healthy blood pressure and healthy cholesterol numbers  Heart disease screening: Screening for heart disease includes screening for blood pressure, fasting lipids, glucose/diabetes screening, BMI height to weight ratio, reviewed of smoking status, physical activity, and diet.    Goals include blood pressure 120/80 or less, maintaining a healthy lipid/cholesterol profile, preventing diabetes or keeping diabetes numbers under good control, not smoking or using tobacco products, exercising most days per week or at least 150 minutes per week of exercise, and eating healthy variety of fruits and vegetables, healthy oils, and avoiding unhealthy food choices like fried food, fast food, high sugar and high cholesterol foods.    Other tests may possibly include EKG test, CT coronary calcium score, echocardiogram, exercise treadmill stress test.     Medical care options: I recommend you continue to seek care here first for routine care.  We try really hard to have available appointments Monday through Friday daytime hours for sick visits, acute visits, and physicals.  Urgent care should be used for after hours and weekends for significant issues that cannot wait till the next day.  The emergency department should be used for significant potentially life-threatening emergencies.  The emergency department is expensive, can often have long wait times for less significant concerns, so try to utilize primary care, urgent care, or telemedicine when possible to avoid unnecessary trips to the emergency department.  Virtual visits and telemedicine have been introduced since the pandemic started in 2020, and can be convenient ways to receive medical care.  We offer virtual appointments as well to assist you in a variety of options to seek medical care.    Separate significant issues  discussed: Multiple sclerosis-managed by neurology.  I reviewed recent records in the chart  Hypertension-he will monitor blood pressures at home.  Lately his readings at local pharmacy have been within normal limits per his report.  We discussed the risk of uncontrolled high blood pressure.  We discussed possibility of treatment if his numbers are not at goal  BMI greater than 30-discussed need to lose weight through healthy diet and exercise, limit salt  He has had a few rare cases of blood in the stool after straining or constipation.  Likely due to hemorrhoids.  If this continues to be a problem or with more frequent blood then we will need to pursue gastroenterology consult   Rishawn was seen today for new pt.  Diagnoses and all orders for this visit:  Encounter for health maintenance examination in adult -     Comprehensive metabolic panel -     CBC -     TSH -     Lipid panel -     Urinalysis -     Hemoglobin A1c -     Hepatitis C antibody  Need for Tdap vaccination  Screening for lipid disorders -     Lipid panel  Multiple sclerosis (HCC)  Screening for diabetes mellitus -     Hemoglobin A1c  Other orders -  Tdap vaccine greater than or equal to 7yo IM   Follow-up pending labs, yearly for physical

## 2020-08-20 LAB — COMPREHENSIVE METABOLIC PANEL
ALT: 29 IU/L (ref 0–44)
AST: 31 IU/L (ref 0–40)
Albumin/Globulin Ratio: 2.7 — ABNORMAL HIGH (ref 1.2–2.2)
Albumin: 5.1 g/dL — ABNORMAL HIGH (ref 4.0–5.0)
Alkaline Phosphatase: 61 IU/L (ref 44–121)
BUN/Creatinine Ratio: 10 (ref 9–20)
BUN: 11 mg/dL (ref 6–20)
Bilirubin Total: 0.9 mg/dL (ref 0.0–1.2)
CO2: 26 mmol/L (ref 20–29)
Calcium: 9.7 mg/dL (ref 8.7–10.2)
Chloride: 100 mmol/L (ref 96–106)
Creatinine, Ser: 1.13 mg/dL (ref 0.76–1.27)
Globulin, Total: 1.9 g/dL (ref 1.5–4.5)
Glucose: 87 mg/dL (ref 65–99)
Potassium: 4.7 mmol/L (ref 3.5–5.2)
Sodium: 139 mmol/L (ref 134–144)
Total Protein: 7 g/dL (ref 6.0–8.5)
eGFR: 87 mL/min/{1.73_m2} (ref >59)

## 2020-08-20 LAB — LIPID PANEL
Chol/HDL Ratio: 3.9 ratio (ref 0.0–5.0)
Cholesterol, Total: 231 mg/dL — ABNORMAL HIGH (ref 100–199)
HDL: 59 mg/dL (ref >39)
LDL Chol Calc (NIH): 163 mg/dL — ABNORMAL HIGH (ref 0–99)
Triglycerides: 54 mg/dL (ref 0–149)
VLDL Cholesterol Cal: 9 mg/dL (ref 5–40)

## 2020-08-20 LAB — CBC
Hematocrit: 44.8 % (ref 37.5–51.0)
Hemoglobin: 14.7 g/dL (ref 13.0–17.7)
MCH: 26.7 pg (ref 26.6–33.0)
MCHC: 32.8 g/dL (ref 31.5–35.7)
MCV: 82 fL (ref 79–97)
Platelets: 303 10*3/uL (ref 150–450)
RBC: 5.5 x10E6/uL (ref 4.14–5.80)
RDW: 13.1 % (ref 11.6–15.4)
WBC: 4.6 10*3/uL (ref 3.4–10.8)

## 2020-08-20 LAB — URINALYSIS
Bilirubin, UA: NEGATIVE
Glucose, UA: NEGATIVE
Ketones, UA: NEGATIVE
Leukocytes,UA: NEGATIVE
Nitrite, UA: NEGATIVE
Protein,UA: NEGATIVE
RBC, UA: NEGATIVE
Specific Gravity, UA: 1.023 (ref 1.005–1.030)
Urobilinogen, Ur: 0.2 mg/dL (ref 0.2–1.0)
pH, UA: 5.5 (ref 5.0–7.5)

## 2020-08-20 LAB — HEPATITIS C ANTIBODY: Hep C Virus Ab: <0.1 s/co ratio (ref 0.0–0.9)

## 2020-08-20 LAB — HEMOGLOBIN A1C
Est. average glucose Bld gHb Est-mCnc: 114 mg/dL
Hgb A1c MFr Bld: 5.6 % (ref 4.8–5.6)

## 2020-08-20 LAB — TSH: TSH: 1.59 u[IU]/mL (ref 0.450–4.500)

## 2020-08-22 NOTE — Patient Instructions (Signed)
This visit was a preventative care visit, also known as wellness visit or routine physical.   Topics typically include healthy lifestyle, diet, exercise, preventative care, vaccinations, sick and well care, proper use of emergency dept and after hours care, as well as other concerns.       Recommendations: Continue to return yearly for your annual wellness and preventative care visits.  This gives Lucas Berry a chance to discuss healthy lifestyle, exercise, vaccinations, review your chart record, and perform screenings where appropriate.   I recommend you see your eye doctor yearly for routine vision care.   I recommend you see your dentist yearly for routine dental care including hygiene visits twice yearly.     Vaccination recommendations were reviewed     Immunization History  Administered Date(s) Administered   Tdap 08/19/2020    Counseled on the Tdap (tetanus, diptheria, and acellular pertussis) vaccine.  Vaccine information sheet given. Tdap vaccine given after consent obtained.   I recommend a yearly flu shot in the fall   Get Lucas Berry a copy of your covid vaccine records     Screening for cancer: Colon cancer screening: Age 50   Testicular cancer screening You should do a monthly self testicular exam if you are between 1-26 years old   We discussed PSA, prostate exam, and prostate cancer screening risks/benefits.   Age 35   Skin cancer screening: Check your skin regularly for new changes, growing lesions, or other lesions of concern Come in for evaluation if you have skin lesions of concern.   Lung cancer screening: If you have a greater than 20 pack year history of tobacco use, then you may qualify for lung cancer screening with a chest CT scan.   Please call your insurance company to inquire about coverage for this test.   We currently don't have screenings for other cancers besides breast, cervical, colon, and lung cancers.  If you have a strong family history of cancer or have  other cancer screening concerns, please let me know.      Bone health: Get at least 150 minutes of aerobic exercise weekly Get weight bearing exercise at least once weekly Bone density test:  A bone density test is an imaging test that uses a type of X-ray to measure the amount of calcium and other minerals in your bones. The test may be used to diagnose or screen you for a condition that causes weak or thin bones (osteoporosis), predict your risk for a broken bone (fracture), or determine how well your osteoporosis treatment is working. The bone density test is recommended for females 65 and older, or females or males <65 if certain risk factors such as thyroid disease, long term use of steroids such as for asthma or rheumatological issues, vitamin D deficiency, estrogen deficiency, family history of osteoporosis, self or family history of fragility fracture in first degree relative.       Heart health: Get at least 150 minutes of aerobic exercise weekly Limit alcohol It is important to maintain a healthy blood pressure and healthy cholesterol numbers   Heart disease screening: Screening for heart disease includes screening for blood pressure, fasting lipids, glucose/diabetes screening, BMI height to weight ratio, reviewed of smoking status, physical activity, and diet.     Goals include blood pressure 120/80 or less, maintaining a healthy lipid/cholesterol profile, preventing diabetes or keeping diabetes numbers under good control, not smoking or using tobacco products, exercising most days per week or at least 150 minutes per week of  exercise, and eating healthy variety of fruits and vegetables, healthy oils, and avoiding unhealthy food choices like fried food, fast food, high sugar and high cholesterol foods.     Other tests may possibly include EKG test, CT coronary calcium score, echocardiogram, exercise treadmill stress test.        Medical care options: I recommend you continue to  seek care here first for routine care.  We try really hard to have available appointments Monday through Friday daytime hours for sick visits, acute visits, and physicals.  Urgent care should be used for after hours and weekends for significant issues that cannot wait till the next day.  The emergency department should be used for significant potentially life-threatening emergencies.  The emergency department is expensive, can often have long wait times for less significant concerns, so try to utilize primary care, urgent care, or telemedicine when possible to avoid unnecessary trips to the emergency department.  Virtual visits and telemedicine have been introduced since the pandemic started in 2020, and can be convenient ways to receive medical care.  We offer virtual appointments as well to assist you in a variety of options to seek medical care.       Separate significant issues discussed: Multiple sclerosis-managed by neurology.  I reviewed recent records in the chart   Hypertension-he will monitor blood pressures at home.  Lately his readings at local pharmacy have been within normal limits per his report.  We discussed the risk of uncontrolled high blood pressure.  We discussed possibility of treatment if his numbers are not at goal   BMI greater than 30-discussed need to lose weight through healthy diet and exercise, limit salt   He has had a few rare cases of blood in the stool after straining or constipation.  Likely due to hemorrhoids.  If this continues to be a problem or with more frequent blood then we will need to pursue gastroenterology consult

## 2020-10-20 ENCOUNTER — Other Ambulatory Visit: Payer: Self-pay

## 2020-10-20 ENCOUNTER — Other Ambulatory Visit: Payer: Self-pay | Admitting: Physician Assistant

## 2020-10-20 ENCOUNTER — Ambulatory Visit (INDEPENDENT_AMBULATORY_CARE_PROVIDER_SITE_OTHER): Payer: Self-pay

## 2020-10-20 DIAGNOSIS — M79672 Pain in left foot: Secondary | ICD-10-CM

## 2020-10-20 DIAGNOSIS — S99922A Unspecified injury of left foot, initial encounter: Secondary | ICD-10-CM

## 2020-11-22 NOTE — Patient Instructions (Incomplete)
Below is our plan:  We will continue Ocrevus infusions. Continue vitamin D supplements. We will update labs, today.   Please make sure you are staying well hydrated. I recommend 50-60 ounces daily. Well balanced diet and regular exercise encouraged. Consistent sleep schedule with 6-8 hours recommended.   Please continue follow up with care team as directed.   Follow up with Dr Epimenio Foot in 6 months   You may receive a survey regarding today's visit. I encourage you to leave honest feed back as I do use this information to improve patient care. Thank you for seeing me today!

## 2020-11-22 NOTE — Progress Notes (Deleted)
No chief complaint on file.    HISTORY OF PRESENT ILLNESS:  11/22/20 ALL:  Lucas Berry is a 35 y.o. male here today for follow up for  RRMS. He continues Ocrevus. Labs stable 07/2020. MRI brain stable 12/2019.   Intermittent diplopia when he is tired. Gait is normal. May have some mild left lower ext weakness walking for prolong periods of time.   He has mild urinary hesitancy that is stable.   He continues vit D 5000iu daily.   HISTORY (copied from Dr Bonnita Hollow previous note)  Lucas Berry is a 35 y.o. 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      Update 02/03/2020: He is on Ocrevus.  He tolerates it well.   No exacerbations or new neurologic symptoms.    He has had some diplopia and it may be slightly worse if he is tired   Actual acuity is fine.  He is walking well.   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  Sometimes he will hold the bannister.   He notes his left leg sometimes gives out at times.  He walks a few miles daily at work.   He also exercises regularly.     He notes very mild left leg weakness but no spasticity or numbness.    He has some urinary hesitancy but did not get much of any benefit from Rapaflo, Alfuzoson or Flomax.   He occasionally has lightheadedness, usually if exerting more.     His last MRI of the brain and cervical spine 12/19 showed no new lesions compared to the previous scan.   He feels more sluggish.   He sleeps well most nights.   Weight is stable.   He snores. Wife has not told him he has OSA signs.   He is sometimes sleepy during the day.   EPWORTH SLEEPINESS SCALE   On a scale of 0 - 3 what is the chance of dozing:   Sitting and Reading:                           1 Watching TV:                                      2 Sitting inactive in a public place:        1 Passenger in car for one hour:           1 Lying down to rest in the afternoon:   2 Sitting and talking to  someone:          0 Sitting quietly after lunch:                   0 In a car, stopped in traffic:                  0   Total (out of 24):    7/24 (normal)    MS History:   In December 2015 he had a several month history of dizziness, ataxic gait and hand numbness.    MRI early December 2015 showed multiple white matter foci including several in the posterior fossa (left middle cerebellar peduncle, pons, medulla) and periventricular foci in both hemispheres. He also had a spine focus at C2.  There  were no acute lesions (no enhancement, no DWI brightness).  Blood work showed normal or negative vasculitis labs, ACE, Lyme, HIV, RPR, pan ANCA, and anti-NMO antibody.   Visual evoked potentials were abnormal showing prolongation of the P100 to about 130 ms on either side.  Lumbar puncture on 03/05/2014 showed abnormal CSF with > 5 oligoclonal bands and elevated IgG index of 0.78.    MRI of the brain 12/15/2014 was unchanged.  He started Gilenya after diagnosis.  He did well with it.  However, he switched ocrelizumab in late 2017 due to a desire to start a family and the possible risk of increased birth defects, even when the male is taking Gilenya.     MRI of the cervical spine 12/04/2017 shows multiple T2 hyperintense foci within the spinal cord located just below the cervicomedullary junction, adjacent to C2 to the left, posteriorly adjacent to C3, posteriorly to the right adjacent to C3-C4, posteriorly adjacent to C4-C5, laterally to the right adjacent to C6, posteriorly adjacent to C7 and lateral to the left adjacent to T1.   None of these appear to be acute and none of them enhance after contrast..   MRI of the brain 12/04/2017 shows foci within the spinal cord adjacent to C2 and C3. There are several small T2/FLAIR hyperintense foci in the cerebellar hemispheres.  Foci are noted with in the medulla, left middle cerebellar peduncle, pons, and bilaterally in the thalamus.  In the hemispheres, there are  multiple T2/FLAIR hyperintense foci in the periventricular, juxtacortical and deep white matter.  Many of the periventricular foci are radially oriented to the ventricles. Some foci are hypointense on T1-weighted images. There were no enhancing lesions. When compared to the MRI dated 07/11/2016, there are no new lesions.    MRI of the brain 12/09/2019 showed T2/flair hyperintense foci in the hemispheres, brainstem, thalamus, left middle cerebellar peduncle and spinal cord in a pattern and configuration consistent with chronic demyelinating plaque associated with multiple sclerosis.  None of the foci enhances or appears to be acute.  Compared to the MRI dated 12/04/2017, there do not appear to be any new lesions.   .   Chronic sinusitis involving the left maxillary, ethmoid and frontal sinuses.   REVIEW OF SYSTEMS: Out of a complete 14 system review of symptoms, the patient complains only of the following symptoms, and all other reviewed systems are negative.   ALLERGIES: No Known Allergies   HOME MEDICATIONS: Outpatient Medications Prior to Visit  Medication Sig Dispense Refill   Cholecalciferol (VITAMIN D PO) Take 5,000 Units by mouth daily.     multivitamin (PROSIGHT) TABS tablet Take 1 tablet by mouth daily.     OCREVUS 300 MG/10ML injection INFUSE 600 MG INTRAVENOUSLY IN 500 ML OF NORMAL SALINE EVERY 6 MONTHS. INFUSE OVER 3.5 HOURS OR LONGER 20 mL 1   Facility-Administered Medications Prior to Visit  Medication Dose Route Frequency Provider Last Rate Last Admin   gadopentetate dimeglumine (MAGNEVIST) injection 20 mL  20 mL Intravenous Once PRN Sater, Pearletha Furl, MD         PAST MEDICAL HISTORY: Past Medical History:  Diagnosis Date   Carpal tunnel syndrome    Hypertension    Movement disorder    Multiple sclerosis (HCC)    Vision abnormalities      PAST SURGICAL HISTORY: Past Surgical History:  Procedure Laterality Date   MOUTH SURGERY  10 years ago     FAMILY  HISTORY: Family History  Problem Relation Age of  Onset   High blood pressure Mother    Cancer Maternal Grandmother    High blood pressure Father      SOCIAL HISTORY: Social History   Socioeconomic History   Marital status: Married    Spouse name: Not on file   Number of children: 2   Years of education: Assoc   Highest education level: Not on file  Occupational History    Employer: OTHER    Comment: [employer]  Tobacco Use   Smoking status: Never   Smokeless tobacco: Never  Substance and Sexual Activity   Alcohol use: Yes    Alcohol/week: 0.0 standard drinks    Comment: weekly   Drug use: No   Sexual activity: Not on file  Other Topics Concern   Not on file  Social History Narrative   Patient lives at home with his family.   Caffeine use: occasionally   Patient has a college education    Patient has 2 children    Patient is right handed    Social Determinants of Health   Financial Resource Strain: Not on file  Food Insecurity: Not on file  Transportation Needs: Not on file  Physical Activity: Not on file  Stress: Not on file  Social Connections: Not on file  Intimate Partner Violence: Not on file     PHYSICAL EXAM  There were no vitals filed for this visit. There is no height or weight on file to calculate BMI.  Generalized: Well developed, in no acute distress  Cardiology: normal rate and rhythm, no murmur auscultated  Respiratory: clear to auscultation bilaterally    Neurological examination  Mentation: Alert oriented to time, place, history taking. Follows all commands speech and language fluent Cranial nerve II-XII: Pupils were equal round reactive to light. Extraocular movements were full, visual field were full on confrontational test. Facial sensation and strength were normal. Uvula tongue midline. Head turning and shoulder shrug  were normal and symmetric. Motor: The motor testing reveals 5 over 5 strength of all 4 extremities. Good  symmetric motor tone is noted throughout.  Sensory: Sensory testing is intact to soft touch on all 4 extremities. No evidence of extinction is noted.  Coordination: Cerebellar testing reveals good finger-nose-finger and heel-to-shin bilaterally.  Gait and station: Gait is normal. Tandem gait is normal. Romberg is negative. No drift is seen.  Reflexes: Deep tendon reflexes are symmetric and normal bilaterally.    DIAGNOSTIC DATA (LABS, IMAGING, TESTING) - I reviewed patient records, labs, notes, testing and imaging myself where available.  Lab Results  Component Value Date   WBC 4.6 08/19/2020   HGB 14.7 08/19/2020   HCT 44.8 08/19/2020   MCV 82 08/19/2020   PLT 303 08/19/2020      Component Value Date/Time   NA 139 08/19/2020 1220   K 4.7 08/19/2020 1220   CL 100 08/19/2020 1220   CO2 26 08/19/2020 1220   GLUCOSE 87 08/19/2020 1220   BUN 11 08/19/2020 1220   CREATININE 1.13 08/19/2020 1220   CALCIUM 9.7 08/19/2020 1220   PROT 7.0 08/19/2020 1220   ALBUMIN 5.1 (H) 08/19/2020 1220   AST 31 08/19/2020 1220   ALT 29 08/19/2020 1220   ALKPHOS 61 08/19/2020 1220   BILITOT 0.9 08/19/2020 1220   GFRNONAA 95 07/13/2015 0852   GFRAA 110 07/13/2015 0852   Lab Results  Component Value Date   CHOL 231 (H) 08/19/2020   HDL 59 08/19/2020   LDLCALC 163 (H) 08/19/2020   TRIG  54 08/19/2020   CHOLHDL 3.9 08/19/2020   Lab Results  Component Value Date   HGBA1C 5.6 08/19/2020   Lab Results  Component Value Date   VITAMINB12 469 12/22/2013   Lab Results  Component Value Date   TSH 1.590 08/19/2020    No flowsheet data found.   No flowsheet data found.   ASSESSMENT AND PLAN  35 y.o. year old male  has a past medical history of Carpal tunnel syndrome, Hypertension, Movement disorder, Multiple sclerosis (HCC), and Vision abnormalities. here with    No diagnosis found.   No orders of the defined types were placed in this encounter.    No orders of the defined types  were placed in this encounter.     Shawnie Dapper, MSN, FNP-C 11/22/2020, 8:24 AM  [facility] [address], [address] [place], [place] [address] [phone]

## 2020-11-23 ENCOUNTER — Ambulatory Visit: Payer: Commercial Managed Care - PPO | Admitting: Family Medicine

## 2020-11-23 ENCOUNTER — Encounter: Payer: Self-pay | Admitting: Neurology

## 2020-11-23 ENCOUNTER — Telehealth: Payer: Self-pay | Admitting: Neurology

## 2020-11-23 DIAGNOSIS — Z79899 Other long term (current) drug therapy: Secondary | ICD-10-CM

## 2020-11-23 DIAGNOSIS — R3911 Hesitancy of micturition: Secondary | ICD-10-CM

## 2020-11-23 DIAGNOSIS — H532 Diplopia: Secondary | ICD-10-CM

## 2020-11-23 DIAGNOSIS — E559 Vitamin D deficiency, unspecified: Secondary | ICD-10-CM

## 2020-11-23 DIAGNOSIS — G35 Multiple sclerosis: Secondary | ICD-10-CM

## 2020-11-23 DIAGNOSIS — R29898 Other symptoms and signs involving the musculoskeletal system: Secondary | ICD-10-CM

## 2020-11-23 NOTE — Telephone Encounter (Signed)
Called the pt to reschedule his apt today for 10 am to another day/time due to provider having an emergency and needing to leave. There was no answer. LVM with this information.  **If pt calls back please reschedule 1st available with Any NP or MD.

## 2020-12-27 ENCOUNTER — Other Ambulatory Visit: Payer: Self-pay

## 2020-12-27 ENCOUNTER — Ambulatory Visit: Payer: Commercial Managed Care - PPO | Admitting: Neurology

## 2020-12-27 ENCOUNTER — Encounter: Payer: Self-pay | Admitting: Neurology

## 2020-12-27 VITALS — BP 135/89 | HR 75 | Ht 69.0 in | Wt 220.0 lb

## 2020-12-27 DIAGNOSIS — H532 Diplopia: Secondary | ICD-10-CM | POA: Diagnosis not present

## 2020-12-27 DIAGNOSIS — Z79899 Other long term (current) drug therapy: Secondary | ICD-10-CM | POA: Diagnosis not present

## 2020-12-27 DIAGNOSIS — R3911 Hesitancy of micturition: Secondary | ICD-10-CM

## 2020-12-27 DIAGNOSIS — G35 Multiple sclerosis: Secondary | ICD-10-CM | POA: Diagnosis not present

## 2020-12-27 DIAGNOSIS — E559 Vitamin D deficiency, unspecified: Secondary | ICD-10-CM

## 2020-12-27 NOTE — Progress Notes (Signed)
[facility]  PATIENT: Lucas Berry DOB: 01/12/1985     HISTORICAL  CHIEF COMPLAINT:  Chief Complaint  Patient presents with   Follow-up    Rm 1, alone. Here for MS f/u, on Ocrevus and tolerating well. Last infusion date: 11/09/2020  Next infusion date: 05/10/2021 at [facility]. Pt reports doing well. C/o blurry vision, worst in the evening     HISTORY OF PRESENT ILLNESS:  Lucas Berry is a 35 y.o. 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     Update 12/27/2020: He is on Ocrevus.  He tolerates it well.   He has no exacerbations or new neurologic symptoms.   Hpwever, he notes some additional symptoms.   He drags the left leg if he does longer disitance.   The left knee might give out so he wears a brace.   Vision is sometimes more blurry later in the day.     This is a mild diplopia.     Both of these symptoms had been associated with initial exacerbatios in 2015.    Visual actual acuity is fine.  He is walking well.   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  Sometimes he will hold the bannister going downstairs.   He walks a few miles daily at work.   He also exercises regularly.     He notes the  mild left leg weakness but no spasticity or numbness.   He has some urinary hesitancy but did not get much of any benefit from Rapaflo, Alfuzoson or Flomax.   He occasionally has lightheadedness, usually if exerting more.    His last MRI of the brain and cervical spine 12/19 showed no new lesions compared to the previous scan.   He feels more sluggish.   He sleeps well most nights.   Weight is stable.   He snores. Wife has not told him he has OSA signs.   He is sometimes sleepy during the day.  EPWORTH SLEEPINESS SCALE  On a scale of 0 - 3 what is the chance of dozing:  Sitting and Reading:   1 Watching TV:    2 Sitting inactive in a public place: 1 Passenger in car for one hour: 1 Lying down to rest  in the afternoon: 2 Sitting and talking to someone: 0 Sitting quietly after lunch:  0 In a car, stopped in traffic:  0  Total (out of 24):    7/24 (normal)   MS History:   In December 2015 he had a several month history of dizziness, ataxic gait and hand numbness.    MRI early December 2015 showed multiple white matter foci including several in the posterior fossa (left middle cerebellar peduncle, pons, medulla) and periventricular foci in both hemispheres. He also had a spine focus at C2.  There were no acute lesions (no enhancement, no DWI brightness).  Blood work showed normal or negative vasculitis labs, ACE, Lyme, HIV, RPR, pan ANCA, and anti-NMO antibody.   Visual evoked potentials were abnormal showing prolongation of the P100 to about 130 ms on either side.  Lumbar puncture on 03/05/2014 showed abnormal CSF with > 5 oligoclonal bands and elevated IgG index of 0.78.    MRI of the brain 12/15/2014 was unchanged.  He started Gilenya after diagnosis.  He did well with it.  However, he switched ocrelizumab in late 2017 due to a desire to start a  family and the possible risk of increased birth defects, even when the male is taking Gilenya.    MRI of the cervical spine 12/04/2017 shows multiple T2 hyperintense foci within the spinal cord located just below the cervicomedullary junction, adjacent to C2 to the left, posteriorly adjacent to C3, posteriorly to the right adjacent to C3-C4, posteriorly adjacent to C4-C5, laterally to the right adjacent to C6, posteriorly adjacent to C7 and lateral to the left adjacent to T1.   None of these appear to be acute and none of them enhance after contrast..  MRI of the brain 12/04/2017 shows foci within the spinal cord adjacent to C2 and C3. There are several small T2/FLAIR hyperintense foci in the cerebellar hemispheres.  Foci are noted with in the medulla, left middle cerebellar peduncle, pons, and bilaterally in the thalamus.  In the hemispheres, there are multiple  T2/FLAIR hyperintense foci in the periventricular, juxtacortical and deep white matter.  Many of the periventricular foci are radially oriented to the ventricles. Some foci are hypointense on T1-weighted images. There were no enhancing lesions. When compared to the MRI dated 07/11/2016, there are no new lesions.   MRI of the brain 12/09/2019 showed T2/flair hyperintense foci in the hemispheres, brainstem, thalamus, left middle cerebellar peduncle and spinal cord in a pattern and configuration consistent with chronic demyelinating plaque associated with multiple sclerosis.  None of the foci enhances or appears to be acute.  Compared to the MRI dated 12/04/2017, there do not appear to be any new lesions.   .   Chronic sinusitis involving the left maxillary, ethmoid and frontal sinuses.  ROS: Out of a complete 14 system review of symptoms, the patient complains only of the following symptoms, and all other reviewed systems are negative.  He notes difficulty with urinary hesitancy.  He has fatigue   ALLERGIES: No Known Allergies  HOME MEDICATIONS:  Current Outpatient Medications:    Cholecalciferol (VITAMIN D PO), Take 5,000 Units by mouth daily., Disp: , Rfl:    multivitamin (PROSIGHT) TABS tablet, Take 1 tablet by mouth daily., Disp: , Rfl:    OCREVUS 300 MG/10ML injection, INFUSE 600 MG INTRAVENOUSLY IN 500 ML OF NORMAL SALINE EVERY 6 MONTHS. INFUSE OVER 3.5 HOURS OR LONGER, Disp: 20 mL, Rfl: 1 No current facility-administered medications for this visit.  Facility-Administered Medications Ordered in Other Visits:    gadopentetate dimeglumine (MAGNEVIST) injection 20 mL, 20 mL, Intravenous, Once PRN, Kalie Cabral, Nanine Means, MD  PAST MEDICAL HISTORY: Past Medical History:  Diagnosis Date   Carpal tunnel syndrome    Hypertension    Movement disorder    Multiple sclerosis ([place])    Vision abnormalities     PAST SURGICAL HISTORY: Past Surgical History:  Procedure Laterality Date   MOUTH SURGERY   10 years ago    FAMILY HISTORY: Family History  Problem Relation Age of Onset   High blood pressure Mother    Cancer Maternal Grandmother    High blood pressure Father     SOCIAL HISTORY:  Social History   Socioeconomic History   Marital status: Married    Spouse name: Not on file   Number of children: 2   Years of education: Assoc   Highest education level: Not on file  Occupational History    Employer: OTHER    Comment: [employer]  Tobacco Use   Smoking status: Never   Smokeless tobacco: Never  Substance and Sexual Activity   Alcohol use: Yes    Alcohol/week: 0.0 standard  drinks    Comment: weekly   Drug use: No   Sexual activity: Not on file  Other Topics Concern   Not on file  Social History Narrative   Patient lives at home with his family.   Caffeine use: occasionally   Patient has a college education    Patient has 2 children    Patient is right handed    Social Determinants of Health   Financial Resource Strain: Not on file  Food Insecurity: Not on file  Transportation Needs: Not on file  Physical Activity: Not on file  Stress: Not on file  Social Connections: Not on file  Intimate Partner Violence: Not on file     PHYSICAL EXAM  Vitals:   12/27/20 1053  BP: 135/89  Pulse: 75  Weight: 220 lb (99.8 kg)  Height: 5\' 9"  (1.753 m)    Body mass index is 32.49 kg/m.   General: The patient is well-developed and well-nourished and in no acute distress    Neurologic Exam  Mental status: The patient is alert and oriented x 3 at the time of the examination. The patient has apparent normal recent and remote memory, with an apparently normal attention span and concentration ability.   Speech is normal.  Cranial nerves: Extraocular movements are full.  Saccadic pursuit was mildly abnormal looking to the left.  There were a few beats of nystagmus looking to the left..  There is a slight left APD.  Color vision is symmetric.  Facial strength and  sensation is normal.  Trapezius strength is normal..  No dysarthria is noted. Hearing appears normal and symmetric.  Motor:  Muscle bulk is normal.   Tone is normal. Strength is  5 / 5 in all 4 extremities on formal testing.functional testing he has very slight weakness in the left leg upon rising from a squat.   Coordination: Cerebellar testing shows good finger-nose-finger and heel-to-shin bilaterally  Sensory: He reports symmetric sensation to touch and temperature in the arms and legs.  DTRs:   Deep tendon reflexes are normal in the arms, 3+ at the knees and ankles.  Gait and station: Station is normal.  B gait was normal.  Tandem gait was normal... Romberg is negative..       ASSESSMENT AND PLAN  Relapsing remitting multiple sclerosis (HCC) - Plan: IgG, IgA, IgM, CBC with Differential/Platelet, MR BRAIN W WO CONTRAST  High risk medication use - Plan: IgG, IgA, IgM, CBC with Differential/Platelet  Diplopia  Urinary hesitancy  Vitamin D deficiency   1.  Continue Ocrevus.  We will check IgG/IgM and CBC.  MRI of the brain to determine if there is any breakthrough activity.  If this is occurring we would need to consider a different disease modifying therapy. 2.  Stay active and exercise as tolerated.   Careful on stairs or ladder.   3.  He has urinary hesitancy that did not respond to medications.  If bladder issues worsen refer to urology.   4.  Continue Vit D OTC supp's 5.  Return in 6 months or sooner if there are new or worsening neurologic symptoms.   Jaydian Santana A. , MD, PhD 12/27/2020, 12:23 PM Certified in Neurology, Clinical Neurophysiology, Sleep Medicine, Pain Medicine and Neuroimaging  [facility] [address], [address] [place], [place] [phone]

## 2020-12-28 LAB — CBC WITH DIFFERENTIAL/PLATELET
Basophils Absolute: 0 10*3/uL (ref 0.0–0.2)
Basos: 1 %
EOS (ABSOLUTE): 0 10*3/uL (ref 0.0–0.4)
Eos: 1 %
Hematocrit: 45.8 % (ref 37.5–51.0)
Hemoglobin: 14.9 g/dL (ref 13.0–17.7)
Immature Grans (Abs): 0 10*3/uL (ref 0.0–0.1)
Immature Granulocytes: 0 %
Lymphocytes Absolute: 1.5 10*3/uL (ref 0.7–3.1)
Lymphs: 28 %
MCH: 26.2 pg — ABNORMAL LOW (ref 26.6–33.0)
MCHC: 32.5 g/dL (ref 31.5–35.7)
MCV: 81 fL (ref 79–97)
Monocytes Absolute: 0.4 10*3/uL (ref 0.1–0.9)
Monocytes: 7 %
Neutrophils Absolute: 3.3 10*3/uL (ref 1.4–7.0)
Neutrophils: 63 %
Platelets: 272 10*3/uL (ref 150–450)
RBC: 5.68 x10E6/uL (ref 4.14–5.80)
RDW: 13.2 % (ref 11.6–15.4)
WBC: 5.2 10*3/uL (ref 3.4–10.8)

## 2020-12-28 LAB — IGG, IGA, IGM
IgA/Immunoglobulin A, Serum: 125 mg/dL (ref 90–386)
IgG (Immunoglobin G), Serum: 980 mg/dL (ref 603–1613)
IgM (Immunoglobulin M), Srm: 36 mg/dL (ref 20–172)

## 2021-01-11 ENCOUNTER — Telehealth: Payer: Self-pay | Admitting: Neurology

## 2021-01-11 NOTE — Telephone Encounter (Signed)
LVM for pt to call back to schedule  [employer] auth: [id number] (exp. 01/01/21 to 04/10/21)

## 2021-04-06 ENCOUNTER — Telehealth: Payer: Self-pay | Admitting: *Deleted

## 2021-04-06 NOTE — Telephone Encounter (Signed)
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 Advised part of the criteria he has to meet per form is for him to have recent MRI showing no new lesions compared to previous imaging.  ? ?Per our records, it appears he has not completed MRI brain Dr. Epimenio Foot ordered for him back on 12/27/20. Asked he call back and ask to speak with Irving Burton (MRI coordinator) to get this set up. She will need to get authorization via insurance first.  ?

## 2021-04-10 NOTE — Telephone Encounter (Signed)
MR Brain w/wo contrast Dr. Trilby Leaver Berkley Harvey: [id number] (exp. 01/11/21 to 05/10/21). Patient is scheduled at [facility] for 04/19/21  ?

## 2021-04-19 ENCOUNTER — Ambulatory Visit: Payer: Commercial Managed Care - PPO

## 2021-04-19 DIAGNOSIS — G35 Multiple sclerosis: Secondary | ICD-10-CM

## 2021-04-19 MED ORDER — GADOBENATE DIMEGLUMINE 529 MG/ML IV SOLN
20.0000 mL | Freq: Once | INTRAVENOUS | Status: AC | PRN
  Administered 2021-04-19: 20 mL via INTRAVENOUS

## 2021-04-20 NOTE — Telephone Encounter (Signed)
Completed form. Per Dr. 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 ?Recent MRI Brain 04/19/21 looked good. Showed old MS lesions, but there were no new ones per Dr. Epimenio Foot.  ? ?I faxed for to [employer] at [phone]. Received fax confirmation. ?

## 2021-06-27 ENCOUNTER — Telehealth: Payer: Self-pay | Admitting: Neurology

## 2021-06-27 ENCOUNTER — Ambulatory Visit: Payer: Commercial Managed Care - PPO | Admitting: Family Medicine

## 2021-06-27 ENCOUNTER — Encounter: Payer: Self-pay | Admitting: Family Medicine

## 2021-06-27 VITALS — BP 134/82 | HR 93 | Ht 69.0 in | Wt 226.0 lb

## 2021-06-27 DIAGNOSIS — H532 Diplopia: Secondary | ICD-10-CM | POA: Diagnosis not present

## 2021-06-27 DIAGNOSIS — E559 Vitamin D deficiency, unspecified: Secondary | ICD-10-CM | POA: Diagnosis not present

## 2021-06-27 DIAGNOSIS — G35 Multiple sclerosis: Secondary | ICD-10-CM | POA: Diagnosis not present

## 2021-06-27 DIAGNOSIS — R3911 Hesitancy of micturition: Secondary | ICD-10-CM

## 2021-06-27 DIAGNOSIS — Z79899 Other long term (current) drug therapy: Secondary | ICD-10-CM

## 2021-06-27 DIAGNOSIS — R29898 Other symptoms and signs involving the musculoskeletal system: Secondary | ICD-10-CM

## 2021-06-27 NOTE — Telephone Encounter (Signed)
Pt called, running late to appt. Informed pt may have to reschedule. Pt did not wish to reschedule.

## 2021-06-28 LAB — CBC WITH DIFFERENTIAL/PLATELET
Basophils Absolute: 0 10*3/uL (ref 0.0–0.2)
Basos: 1 %
EOS (ABSOLUTE): 0 10*3/uL (ref 0.0–0.4)
Eos: 1 %
Hematocrit: 43.7 % (ref 37.5–51.0)
Hemoglobin: 14.3 g/dL (ref 13.0–17.7)
Immature Grans (Abs): 0 10*3/uL (ref 0.0–0.1)
Immature Granulocytes: 0 %
Lymphocytes Absolute: 1.5 10*3/uL (ref 0.7–3.1)
Lymphs: 22 %
MCH: 26.7 pg (ref 26.6–33.0)
MCHC: 32.7 g/dL (ref 31.5–35.7)
MCV: 82 fL (ref 79–97)
Monocytes Absolute: 0.5 10*3/uL (ref 0.1–0.9)
Monocytes: 7 %
Neutrophils Absolute: 4.6 10*3/uL (ref 1.4–7.0)
Neutrophils: 69 %
Platelets: 294 10*3/uL (ref 150–450)
RBC: 5.35 x10E6/uL (ref 4.14–5.80)
RDW: 13.1 % (ref 11.6–15.4)
WBC: 6.7 10*3/uL (ref 3.4–10.8)

## 2021-06-28 LAB — IGG, IGA, IGM
IgA/Immunoglobulin A, Serum: 122 mg/dL (ref 90–386)
IgG (Immunoglobin G), Serum: 928 mg/dL (ref 603–1613)
IgM (Immunoglobulin M), Srm: 62 mg/dL (ref 20–172)

## 2021-06-28 LAB — VITAMIN D 25 HYDROXY (VIT D DEFICIENCY, FRACTURES): Vit D, 25-Hydroxy: 33.9 ng/mL (ref 30.0–100.0)

## 2021-09-06 ENCOUNTER — Encounter: Payer: Self-pay | Admitting: Internal Medicine

## 2021-10-10 ENCOUNTER — Encounter: Payer: Self-pay | Admitting: Internal Medicine

## 2022-01-04 ENCOUNTER — Ambulatory Visit: Payer: Commercial Managed Care - PPO | Admitting: Neurology

## 2022-02-06 ENCOUNTER — Ambulatory Visit (INDEPENDENT_AMBULATORY_CARE_PROVIDER_SITE_OTHER): Payer: Commercial Managed Care - PPO | Admitting: Neurology

## 2022-02-06 ENCOUNTER — Encounter: Payer: Self-pay | Admitting: Neurology

## 2022-02-06 VITALS — BP 144/85 | HR 85 | Ht 69.0 in | Wt 221.8 lb

## 2022-02-06 DIAGNOSIS — E559 Vitamin D deficiency, unspecified: Secondary | ICD-10-CM

## 2022-02-06 DIAGNOSIS — R3911 Hesitancy of micturition: Secondary | ICD-10-CM | POA: Diagnosis not present

## 2022-02-06 DIAGNOSIS — Z79899 Other long term (current) drug therapy: Secondary | ICD-10-CM | POA: Diagnosis not present

## 2022-02-06 DIAGNOSIS — G35 Multiple sclerosis: Secondary | ICD-10-CM | POA: Diagnosis not present

## 2022-02-06 DIAGNOSIS — H532 Diplopia: Secondary | ICD-10-CM

## 2022-02-06 DIAGNOSIS — R29898 Other symptoms and signs involving the musculoskeletal system: Secondary | ICD-10-CM | POA: Diagnosis not present

## 2022-02-06 NOTE — Progress Notes (Signed)
[facility]  PATIENT: Lucas Berry DOB: Dec 09, 1985     HISTORICAL  CHIEF COMPLAINT:  Chief Complaint  Patient presents with   Follow-up    RM 16, alone. Last seen 06/27/21. MS DMT: Ocrevus. Tolerating well, no issues. Wife mentioned he twitches during sleep in legs. Sx intermittent.     HISTORY OF PRESENT ILLNESS:  Lucas Berry is a 37 y.o. 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     Update 02/06/2022: He is on Ocrevus.  He tolerates it well.   He has no exacerbations or new neurologic symptoms.     Next infusion is May 2024  He twitches his legs some in his sleep noted by hs wife.  This can occur in either leg but does not wake him up and seems to be just indiviual spasms for a second or two.   He has mild left leg weakness, worse if he walks longer.  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  Sometimes he will hold the bannister going downstairs.   He walks a few miles daily at work.      The left knee might give out so he wears a brace.   He has mild hand numbness since initial exacerbation  Vision is sometimes more blurry later in the day.     This is a mild diplopia, sometimes with visible dysconjugate eyes     Both of these symptoms had been associated with initial exacerbatios in 2015.    He also exercises regularly.     He notes the  mild left leg weakness but no spasticity or numbness.   He has some urinary hesitancy but did not get much of any benefit from Rapaflo, Alfuzoson or Flomax.   He occasionally has lightheadedness, usually if exerting more.     He feels more sluggish.   He sleeps well most nights but sometimes takes > 30 minutes to wake up..   Weight is stable.   He snores. Wife has not told him he has OSA signs.   He is sometimes sleepy during the day.     MS History:   In December 2015 he had a several month history of dizziness, ataxic gait and hand numbness.    MRI early  December 2015 showed multiple white matter foci including several in the posterior fossa (left middle cerebellar peduncle, pons, medulla) and periventricular foci in both hemispheres. He also had a spine focus at C2.  There were no acute lesions (no enhancement, no DWI brightness).  Blood work showed normal or negative vasculitis labs, ACE, Lyme, HIV, RPR, pan ANCA, and anti-NMO antibody.   Visual evoked potentials were abnormal showing prolongation of the P100 to about 130 ms on either side.  Lumbar puncture on 03/05/2014 showed abnormal CSF with > 5 oligoclonal bands and elevated IgG index of 0.78.    MRI of the brain 12/15/2014 was unchanged.  He started Gilenya after diagnosis.  He did well with it.  However, he switched ocrelizumab in late 2017 due to a desire to start a family and the possible risk of increased birth defects, even when the male is taking Gilenya.     IMAGING MRI 04/19/2021 showed T2/FLAIR hyperintense foci in the spinal cord, brainstem, left middle cerebellar peduncle, bilateral thalamus and in the hemispheres in a pattern consistent with chronic demyelinating plaque associated with multiple sclerosis. None of these foci appear  to be acute. They do not enhance. Compared to the MRI from 12/09/2019, there are no new lesions.   MRI of the cervical spine 12/04/2017 shows multiple T2 hyperintense foci within the spinal cord located just below the cervicomedullary junction, adjacent to C2 to the left, posteriorly adjacent to C3, posteriorly to the right adjacent to C3-C4, posteriorly adjacent to C4-C5, laterally to the right adjacent to C6, posteriorly adjacent to C7 and lateral to the left adjacent to T1.   None of these appear to be acute and none of them enhance after contrast..  MRI of the brain 12/04/2017 shows foci within the spinal cord adjacent to C2 and C3. There are several small T2/FLAIR hyperintense foci in the cerebellar hemispheres.  Foci are noted with in the medulla, left middle  cerebellar peduncle, pons, and bilaterally in the thalamus.  In the hemispheres, there are multiple T2/FLAIR hyperintense foci in the periventricular, juxtacortical and deep white matter.  Many of the periventricular foci are radially oriented to the ventricles. Some foci are hypointense on T1-weighted images. There were no enhancing lesions. When compared to the MRI dated 07/11/2016, there are no new lesions.   MRI of the brain 12/09/2019 showed T2/flair hyperintense foci in the hemispheres, brainstem, thalamus, left middle cerebellar peduncle and spinal cord in a pattern and configuration consistent with chronic demyelinating plaque associated with multiple sclerosis.  None of the foci enhances or appears to be acute.  Compared to the MRI dated 12/04/2017, there do not appear to be any new lesions.   .   Chronic sinusitis involving the left maxillary, ethmoid and frontal sinuses.  ROS: Out of a complete 14 system review of symptoms, the patient complains only of the following symptoms, and all other reviewed systems are negative.  He notes difficulty with urinary hesitancy.  He has fatigue   ALLERGIES: No Known Allergies  HOME MEDICATIONS:  Current Outpatient Medications:    multivitamin (PROSIGHT) TABS tablet, Take 1 tablet by mouth daily., Disp: , Rfl:    OCREVUS 300 MG/10ML injection, INFUSE 600 MG INTRAVENOUSLY IN 500 ML OF NORMAL SALINE EVERY 6 MONTHS. INFUSE OVER 3.5 HOURS OR LONGER, Disp: 20 mL, Rfl: 1   Cholecalciferol (VITAMIN D PO), Take 5,000 Units by mouth daily. (Patient not taking: Reported on 02/06/2022), Disp: , Rfl:  No current facility-administered medications for this visit.  Facility-Administered Medications Ordered in Other Visits:    gadopentetate dimeglumine (MAGNEVIST) injection 20 mL, 20 mL, Intravenous, Once PRN, Darianny Momon, Nanine Means, MD  PAST MEDICAL HISTORY: Past Medical History:  Diagnosis Date   Carpal tunnel syndrome    Hypertension    Movement disorder     Multiple sclerosis ([place])    Vision abnormalities     PAST SURGICAL HISTORY: Past Surgical History:  Procedure Laterality Date   MOUTH SURGERY  10 years ago    FAMILY HISTORY: Family History  Problem Relation Age of Onset   High blood pressure Mother    Cancer Maternal Grandmother    High blood pressure Father     SOCIAL HISTORY:  Social History   Socioeconomic History   Marital status: Married    Spouse name: Not on file   Number of children: 2   Years of education: Assoc   Highest education level: Not on file  Occupational History    Employer: OTHER    Comment: [employer]  Tobacco Use   Smoking status: Never   Smokeless tobacco: Never  Substance and Sexual Activity   Alcohol use:  Yes    Comment: socially   Drug use: No   Sexual activity: Not on file  Other Topics Concern   Not on file  Social History Narrative   Patient lives at home with his family.   Caffeine use: 1 drink per day (soda), celcius energy drinks    Patient has a college education    Patient has 2 children    Patient is right handed    Social Determinants of Health   Financial Resource Strain: Not on file  Food Insecurity: Not on file  Transportation Needs: Not on file  Physical Activity: Not on file  Stress: Not on file  Social Connections: Not on file  Intimate Partner Violence: Not on file     PHYSICAL EXAM  Vitals:   02/06/22 1456 02/06/22 1503  BP: (!) 147/86 (!) 144/85  Pulse: 83 85  Weight: 221 lb 12.8 oz (100.6 kg)   Height: 5\' 9"  (1.753 m)     Body mass index is 32.75 kg/m.   General: The patient is well-developed and well-nourished and in no acute distress    Neurologic Exam  Mental status: The patient is alert and oriented x 3 at the time of the examination. The patient has apparent normal recent and remote memory, with an apparently normal attention span and concentration ability.   Speech is normal.  Cranial nerves: Extraocular movements are full.   Saccadic pursuit was mildly abnormal looking to the left.  There were a few beats of nystagmus looking to the left..  There is a slight left APD.  Color vision is symmetric.  Facial strength and sensation is normal.  Trapezius strength is normal..  No dysarthria is noted. Hearing appears normal and symmetric.  Motor:  Muscle bulk is normal.   Tone is normal. Strength is  5 / 5 in all 4 extremities on formal testing.functional testing he has very slight weakness in the left leg upon rising from a squat.   Coordination: Cerebellar testing shows good finger-nose-finger and heel-to-shin bilaterally  Sensory: He reports symmetric sensation to touch and temperature in the arms and legs.  Gait and station: Station is normal.  B gait was normal.  Tandem gait is near normal.  .. Romberg is negative  DTRs:   Deep tendon reflexes are normal in the arms, 3+ at the knees and ankles.     ASSESSMENT AND PLAN  Relapsing remitting multiple sclerosis (HCC)  Left leg weakness  High risk medication use  Urinary hesitancy  Vitamin D deficiency  Diplopia   1.  Continue Ocrevus.  Check IgG/IgM and CBC/D at next visit.  MRI of the brain later in 20124to determine if there is any breakthrough activity.  If this is occurring we would need to consider a different disease modifying therapy. 2.  Stay active and exercise as tolerated.   Careful on stairs or ladder.   3.  He has urinary hesitancy that did not respond to medications.  If bladder issues worsen refer to urology.   4.  Continue Vit D OTC supp's 5.  Possible RLS/PLMS - consider a dopamine agonist. Return in 6 months or sooner if there are new or worsening neurologic symptoms.   Daylah Sayavong A. Felecia Shelling, MD, PhD 06/04/158, 1:09 PM Certified in Neurology, Clinical Neurophysiology, Sleep Medicine, Pain Medicine and Neuroimaging  [facility] [address] [place], [place] [address] [phone]

## 2022-08-15 NOTE — Progress Notes (Signed)
Chief Complaint  Patient presents with   Follow-up    Pt in room 1. Here for MS follow up. On Ocrevus Pt reports doing okay, pt said for 2 months he noticed pain in left hamstring notices daily. Pt said noticed his left foot drags at times, noticed daily as well.    HISTORY OF PRESENT ILLNESS:  08/21/22 ALL:  Lucas Berry is a 37 y.o. male here today for follow up for RRMS. He continues Ocrevus infusions. Last infusion 05/2022. Labs have been unremarkable. MRI brain stable 04/2021.   He reports doing well. No new or exacerbating symptoms. He does have left sided weakness at times when he is tired or hot. He feels that left foot drags at times. He denies falls. No assistive device. He uses banister to walk up or down steps. He has had some left sided hamstring pain for the past 1-2 months. He feels a pulling sensation. No obvious injury. No sensory changes. He continues to work for [employer] and is very active. 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 He is currently working with PT.   He has urinary urgency but feels symptoms are stable. Mood is good. He is sleeping well with exception of left arm pain from wrist fracture. He has occasional diplopia. Last eye exam with ophthalmology last week was reportedly unremarkable. He does not take vitamin D supplements.   HISTORY (copied from Dr Bonnita Hollow previous note)  Lucas Berry is a 37 y.o. 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      Update 02/06/2022: He is on Ocrevus.  He tolerates it well.   He has no exacerbations or new neurologic symptoms.     Next infusion is May 2024   He twitches his legs some in his sleep noted by hs wife.  This can occur in either leg but does not wake him up and seems to be just indiviual spasms for a second or two.   He has mild left leg weakness, worse if he walks longer.  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  Sometimes he will hold the bannister going downstairs.   He walks a few miles daily at work.      The left knee might give out so he wears a brace.   He has mild hand numbness since initial exacerbation  Vision is sometimes more blurry later in the day.     This is a mild diplopia, sometimes with visible dysconjugate eyes     Both of these symptoms had been associated with initial exacerbatios in 2015.     He also exercises regularly.     He notes the  mild left leg weakness but no spasticity or numbness.    He has some urinary hesitancy but did not get much of any benefit from Rapaflo, Alfuzoson or Flomax.   He occasionally has lightheadedness, usually if exerting more.     He feels more sluggish.   He sleeps well most nights but sometimes takes > 30 minutes to wake up..   Weight is stable.   He snores. Wife has not told him he has OSA signs.   He is sometimes sleepy during the day.    MS History:   In December 2015 he had a several month history of dizziness, ataxic gait and hand numbness.  MRI early December 2015 showed multiple white matter foci including several in the posterior fossa (left middle cerebellar peduncle, pons, medulla) and periventricular foci in both hemispheres. He also had a spine focus at C2.  There were no acute lesions (no enhancement, no DWI brightness).  Blood work showed normal or negative vasculitis labs, ACE, Lyme, HIV, RPR, pan ANCA, and anti-NMO antibody.   Visual evoked potentials were abnormal showing prolongation of the P100 to about 130 ms on either side.  Lumbar puncture on 03/05/2014 showed abnormal CSF with > 5 oligoclonal bands and elevated IgG index of 0.78.    MRI of the brain 12/15/2014 was unchanged.  He started Gilenya after diagnosis.  He did well with it.  However, he switched ocrelizumab in late 2017 due to a desire to start a family and the possible risk of increased birth defects, even when the male is taking Gilenya.        IMAGING MRI 04/19/2021 showed T2/FLAIR hyperintense foci in the spinal cord, brainstem, left middle cerebellar peduncle, bilateral thalamus and in the hemispheres in a pattern consistent with chronic demyelinating plaque associated with multiple sclerosis. None of these foci appear to be acute. They do not enhance. Compared to the MRI from 12/09/2019, there are no new lesions.    MRI of the cervical spine 12/04/2017 shows multiple T2 hyperintense foci within the spinal cord located just below the cervicomedullary junction, adjacent to C2 to the left, posteriorly adjacent to C3, posteriorly to the right adjacent to C3-C4, posteriorly adjacent to C4-C5, laterally to the right adjacent to C6, posteriorly adjacent to C7 and lateral to the left adjacent to T1.   None of these appear to be acute and none of them enhance after contrast..   MRI of the brain 12/04/2017 shows foci within the spinal cord adjacent to C2 and C3. There are several small T2/FLAIR hyperintense foci in the cerebellar hemispheres.  Foci are noted with in the medulla, left middle cerebellar peduncle, pons, and bilaterally in the thalamus.  In the hemispheres, there are multiple T2/FLAIR hyperintense foci in the periventricular, juxtacortical and deep white matter.  Many of the periventricular foci are radially oriented to the ventricles. Some foci are hypointense on T1-weighted images. There were no enhancing lesions. When compared to the MRI dated 07/11/2016, there are no new lesions.    MRI of the brain 12/09/2019 showed T2/flair hyperintense foci in the hemispheres, brainstem, thalamus, left middle cerebellar peduncle and spinal cord in a pattern and configuration consistent with chronic demyelinating plaque associated with multiple sclerosis.  None of the foci enhances or appears to be acute.  Compared to the MRI dated 12/04/2017, there do not appear to be any new lesions.   .   Chronic sinusitis involving the left maxillary, ethmoid and  frontal sinuses.   REVIEW OF SYSTEMS: Out of a complete 14 system review of symptoms, the patient complains only of the following symptoms, left leg weakness, left hamstring pain, double vision and all other reviewed systems are negative.   ALLERGIES: No Known Allergies   HOME MEDICATIONS: Outpatient Medications Prior to Visit  Medication Sig Dispense Refill   gabapentin (NEURONTIN) 300 MG capsule Take 300 mg by mouth at bedtime.     multivitamin (PROSIGHT) TABS tablet Take 1 tablet by mouth daily.     OCREVUS 300 MG/10ML injection INFUSE 600 MG INTRAVENOUSLY IN 500 ML OF NORMAL SALINE EVERY 6 MONTHS. INFUSE OVER 3.5 HOURS OR LONGER 20 mL 1   Cholecalciferol (VITAMIN  D PO) Take 5,000 Units by mouth daily. (Patient not taking: Reported on 02/06/2022)     Facility-Administered Medications Prior to Visit  Medication Dose Route Frequency Provider Last Rate Last Admin   gadopentetate dimeglumine (MAGNEVIST) injection 20 mL  20 mL Intravenous Once PRN Sater, Pearletha Furl, MD         PAST MEDICAL HISTORY: Past Medical History:  Diagnosis Date   Carpal tunnel syndrome    Hypertension    Movement disorder    Multiple sclerosis (HCC)    Vision abnormalities      PAST SURGICAL HISTORY: Past Surgical History:  Procedure Laterality Date   MOUTH SURGERY  10 years ago     FAMILY HISTORY: Family History  Problem Relation Age of Onset   High blood pressure Mother    Cancer Maternal Grandmother    High blood pressure Father      SOCIAL HISTORY: Social History   Socioeconomic History   Marital status: Married    Spouse name: Not on file   Number of children: 2   Years of education: Assoc   Highest education level: Not on file  Occupational History    Employer: OTHER    Comment: [employer]  Tobacco Use   Smoking status: Never   Smokeless tobacco: Never  Substance and Sexual Activity   Alcohol use: Yes    Comment: socially   Drug use: No   Sexual activity: Not on  file  Other Topics Concern   Not on file  Social History Narrative   Patient lives at home with his family.   Caffeine use: 1 drink per day (soda), celcius energy drinks    Patient has a college education    Patient has 2 children    Patient is right handed    Social Determinants of Health   Financial Resource Strain: Not on file  Food Insecurity: Not on file  Transportation Needs: Not on file  Physical Activity: Not on file  Stress: Not on file  Social Connections: Unknown (06/18/2022)   [facility] from [facility]   Social Network    Social Network: Not on file  Intimate Partner Violence: Not At Risk (06/18/2022)   Received from [facility]   HITS    Over the last 12 months how often did your partner physically hurt you?: 1    Over the last 12 months how often did your partner insult you or talk down to you?: 1    Over the last 12 months how often did your partner threaten you with physical harm?: 1    Over the last 12 months how often did your partner scream or curse at you?: 1     PHYSICAL EXAM  Vitals:   08/21/22 1459  BP: (!) 138/92  Pulse: 84  Weight: 234 lb 9.6 oz (106.4 kg)  Height: 5\' 9"  (1.753 m)    Body mass index is 34.64 kg/m.  Generalized: Well developed, in no acute distress  Cardiology: normal rate and rhythm, no murmur auscultated  Respiratory: clear to auscultation bilaterally    Neurological examination  Mentation: Alert oriented to time, place, history taking. Follows all commands speech and language fluent Cranial nerve II-XII: Pupils were equal round reactive to light. Extraocular movements were full, visual field were full on confrontational test. Facial sensation and strength were normal. Uvula tongue midline. Head turning and shoulder shrug  were normal and symmetric. Motor: The motor testing reveals 5 over 5 strength of all 4 extremities. Good  symmetric motor tone is noted throughout.  Sensory: Sensory testing is intact to soft touch on  all 4 extremities. No evidence of extinction is noted.  Coordination: Cerebellar testing reveals good finger-nose-finger and heel-to-shin bilaterally.  Gait and station: Gait is normal.  Reflexes: Deep tendon reflexes are symmetric and normal bilaterally.    DIAGNOSTIC DATA (LABS, IMAGING, TESTING) - I reviewed patient records, labs, notes, testing and imaging myself where available.  Lab Results  Component Value Date   WBC 6.7 06/27/2021   HGB 14.3 06/27/2021   HCT 43.7 06/27/2021   MCV 82 06/27/2021   PLT 294 06/27/2021      Component Value Date/Time   NA 139 08/19/2020 1220   K 4.7 08/19/2020 1220   CL 100 08/19/2020 1220   CO2 26 08/19/2020 1220   GLUCOSE 87 08/19/2020 1220   BUN 11 08/19/2020 1220   CREATININE 1.13 08/19/2020 1220   CALCIUM 9.7 08/19/2020 1220   PROT 7.0 08/19/2020 1220   ALBUMIN 5.1 (H) 08/19/2020 1220   AST 31 08/19/2020 1220   ALT 29 08/19/2020 1220   ALKPHOS 61 08/19/2020 1220   BILITOT 0.9 08/19/2020 1220   GFRNONAA 95 07/13/2015 0852   GFRAA 110 07/13/2015 0852   Lab Results  Component Value Date   CHOL 231 (H) 08/19/2020   HDL 59 08/19/2020   LDLCALC 163 (H) 08/19/2020   TRIG 54 08/19/2020   CHOLHDL 3.9 08/19/2020   Lab Results  Component Value Date   HGBA1C 5.6 08/19/2020   Lab Results  Component Value Date   VITAMINB12 469 12/22/2013   Lab Results  Component Value Date   TSH 1.590 08/19/2020        No data to display               No data to display           ASSESSMENT AND PLAN  37 y.o. year old male  has a past medical history of Carpal tunnel syndrome, Hypertension, Movement disorder, Multiple sclerosis (HCC), and Vision abnormalities. here with    Relapsing remitting multiple sclerosis (HCC) - Plan: IgG, IgA, IgM, CBC with Differential/Platelets, MR BRAIN W WO CONTRAST  High risk medication use  Vitamin D deficiency - Plan: Vitamin D, 25-hydroxy  Lucas Berry is doing well, today. We will continue  Ocrevus. I will update labs, today. Will consider vitamin D supplement if needed. He will continue active lifestyle. Could consider PT if left weakness worsens. Try Aleve for 2 weeks for hamstring pain. Could consider muscle relaxant if needed. Healthy lifestyle habits encouraged. He will follow up with PCP as directed. He will return to see Dr Epimenio Foot in 6 months, sooner if needed. He verbalizes understanding and agreement with this plan.   Orders Placed This Encounter  Procedures   MR BRAIN W WO CONTRAST    Standing Status:   Future    Standing Expiration Date:   12/21/2022    Order Specific Question:   If indicated for the ordered procedure, I authorize the administration of contrast media per Radiology protocol    Answer:   Yes    Order Specific Question:   What is the patient's sedation requirement?    Answer:   No Sedation    Order Specific Question:   Does the patient have a pacemaker or implanted devices?    Answer:   No    Order Specific Question:   Preferred imaging location?    Answer:   Internal  IgG, IgA, IgM   CBC with Differential/Platelets   Vitamin D, 25-hydroxy     No orders of the defined types were placed in this encounter.   I spent 30 minutes of face-to-face and non-face-to-face time with patient.  This included previsit chart review, lab review, study review, order entry, electronic health record documentation, patient education.   Shawnie Dapper, MSN, FNP-C 08/21/2022, 3:27 PM  [facility] [address] [place], [place] [address] [phone]

## 2022-08-15 NOTE — Patient Instructions (Addendum)
Below is our plan:  We will continue current treatment plan. Try taking Aleve 2 tablets daily with food for 1-2 weeks. Let me now if this doesn't help with the hamstring pain. I will update labs, today. I will order the MRI for you to have sometime in November. We will have you check back in with Dr Epimenio Foot in 6 months.   Please make sure you are staying well hydrated. I recommend 50-60 ounces daily. Well balanced diet and regular exercise encouraged. Consistent sleep schedule with 6-8 hours recommended.   Please continue follow up with care team as directed.    You may receive a survey regarding today's visit. I encourage you to leave honest feed back as I do use this information to improve patient care. Thank you for seeing me today!

## 2022-08-21 ENCOUNTER — Encounter: Payer: Self-pay | Admitting: Family Medicine

## 2022-08-21 ENCOUNTER — Ambulatory Visit: Payer: Commercial Managed Care - PPO | Admitting: Family Medicine

## 2022-08-21 VITALS — BP 138/92 | HR 84 | Ht 69.0 in | Wt 234.6 lb

## 2022-08-21 DIAGNOSIS — E559 Vitamin D deficiency, unspecified: Secondary | ICD-10-CM

## 2022-08-21 DIAGNOSIS — Z79899 Other long term (current) drug therapy: Secondary | ICD-10-CM | POA: Diagnosis not present

## 2022-08-21 DIAGNOSIS — G35 Multiple sclerosis: Secondary | ICD-10-CM

## 2022-08-22 LAB — CBC WITH DIFFERENTIAL/PLATELET
Basophils Absolute: 0 10*3/uL (ref 0.0–0.2)
Basos: 0 %
EOS (ABSOLUTE): 0.1 10*3/uL (ref 0.0–0.4)
Eos: 2 %
Hematocrit: 46.4 % (ref 37.5–51.0)
Hemoglobin: 14.8 g/dL (ref 13.0–17.7)
Immature Grans (Abs): 0 10*3/uL (ref 0.0–0.1)
Immature Granulocytes: 0 %
Lymphocytes Absolute: 1.5 10*3/uL (ref 0.7–3.1)
Lymphs: 28 %
MCH: 26 pg — ABNORMAL LOW (ref 26.6–33.0)
MCHC: 31.9 g/dL (ref 31.5–35.7)
MCV: 82 fL (ref 79–97)
Monocytes Absolute: 0.4 10*3/uL (ref 0.1–0.9)
Monocytes: 7 %
Neutrophils Absolute: 3.4 10*3/uL (ref 1.4–7.0)
Neutrophils: 63 %
Platelets: 297 10*3/uL (ref 150–450)
RBC: 5.69 x10E6/uL (ref 4.14–5.80)
RDW: 13 % (ref 11.6–15.4)
WBC: 5.4 10*3/uL (ref 3.4–10.8)

## 2022-08-22 LAB — IGG, IGA, IGM
IgA/Immunoglobulin A, Serum: 116 mg/dL (ref 90–386)
IgG (Immunoglobin G), Serum: 1008 mg/dL (ref 603–1613)
IgM (Immunoglobulin M), Srm: 35 mg/dL (ref 20–172)

## 2022-08-22 LAB — VITAMIN D 25 HYDROXY (VIT D DEFICIENCY, FRACTURES): Vit D, 25-Hydroxy: 21.9 ng/mL — ABNORMAL LOW (ref 30.0–100.0)

## 2022-09-26 ENCOUNTER — Telehealth: Payer: Self-pay | Admitting: Family Medicine

## 2022-09-26 NOTE — Telephone Encounter (Signed)
Pt last seen 08/21/22 by AL,NP. Next appt scheduled for 02/28/23. MS DMT: Ocrevus. Last infusion: 05/10/22 and next one is 11/08/22  Called pt. Reports left leg weakness started worsening in last couple weeks. Wife has noticed this more when he is getting dressed, lifting leg. No issues with right leg. Occurs if he becomes tired typically in the past but has now noticed this occurs earlier in the day. Dragging leg while walking. No falls. No illness or infection since last visit. Experiencing what he thinks is foot drop in left foot.  Intermittent throughout the day.   Still on light duty at work d/t wrist fracture and doing PT for this. Ok to leave detailed message

## 2022-09-26 NOTE — Telephone Encounter (Signed)
Pt is asking for a call to discuss an increase in weakness of left leg, please call.

## 2022-09-26 NOTE — Telephone Encounter (Signed)
Called pt back. Offered to have him speak with MRI coordinator here to move up MRI. He declined at this time. He will continue to monitor sx. If they worsen or he develops any new sx, he will call back.

## 2022-09-26 NOTE — Telephone Encounter (Signed)
Amy, looks like he has MRI brain w/wo scheduled for 11/20/22. Did you want to offer to do additional imaging besides this?

## 2022-11-08 ENCOUNTER — Telehealth: Payer: Self-pay | Admitting: Neurology

## 2022-11-08 NOTE — Telephone Encounter (Signed)
Patient Paid $50 for form being completed by Dr. Liliane Shi

## 2022-11-12 DIAGNOSIS — Z0289 Encounter for other administrative examinations: Secondary | ICD-10-CM

## 2022-11-19 ENCOUNTER — Telehealth: Payer: Self-pay

## 2022-11-19 NOTE — Telephone Encounter (Addendum)
Faxed paperwork to [facility] [phone] on 11/19/2022.

## 2022-11-20 ENCOUNTER — Ambulatory Visit (INDEPENDENT_AMBULATORY_CARE_PROVIDER_SITE_OTHER): Payer: Commercial Managed Care - PPO

## 2022-11-20 DIAGNOSIS — G35 Multiple sclerosis: Secondary | ICD-10-CM

## 2022-11-20 MED ORDER — GADOBENATE DIMEGLUMINE 529 MG/ML IV SOLN
20.0000 mL | Freq: Once | INTRAVENOUS | Status: AC | PRN
  Administered 2022-11-20: 20 mL via INTRAVENOUS

## 2022-11-27 NOTE — Telephone Encounter (Signed)
Patient was transferred over to new patient referrals after calling in on the main line. States that [facility] did not receive faxed paperwork. He is asking that it be faxed to [phone]

## 2022-11-27 NOTE — Telephone Encounter (Signed)
Was able to find paperwork and faxed to new number provided by patient

## 2022-11-28 NOTE — Telephone Encounter (Signed)
Portions of form were not filled out. I completed and faxed back to [phone]. Received fax confirmation.

## 2023-02-03 NOTE — Progress Notes (Addendum)
 [facility]  PATIENT: Lucas Berry DOB: Aug 16, 1985     HISTORICAL  CHIEF COMPLAINT:  Chief Complaint  Patient presents with   Multiple Sclerosis    RM 10  alone Pt is well, reports he is having more issues with his L foot feeling heavy. No other concerns.     HISTORY OF PRESENT ILLNESS:  Lucas Berry is a 37 y.o. 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     Update 02/06/2023: He is on Ocrevus .  He tolerates it well.   He has no exacerbations .  However, he notes that the left leg seems a little bit worse, especially in the afternoons.  His gait has slowed.  He sometimes has difficulty raising the left leg when he put his pants on or when he enters the car.     Next infusion is May 2025.  Since the last visit he had MRI of the brain 11/20/2022 showing foci in the hemispheres, spinal cord, brainstem and cerebellum and thalamus consistent with MS.  There were no acute findings.  No change compared to the 04/19/2021 MRI.  The left leg is mildly weak with some effect on his gait.  He continues to note some twitches in the left leg.  His wife notes his leg twitches some at night..  There is mild left leg spasticity.  He has mild hand numbness since initial exacerbation  Vision is sometimes more blurry later in the day.     This is a mild diplopia, sometimes with visible dysconjugate eyes     Both of these symptoms had been associated with initial exacerbatios in 2015.    He used to exercises regularly but doing less more recently.    He has some urinary hesitancy but did not get much of any benefit from Rapaflo , Alfuzoson or Flomax .   He occasionally has lightheadedness, usually if exerting more.     He sleeps well most nights   Weight is stable.   He snores. Wife has not told him he has OSA signs.   He is sometimes sleepy during the day.   He will be having left wrist surgery in a a couple weeks.  for continued issues after a fracture.   We  discussed OCrevus  slightly increases risk of infection but since he is young, the risk is not increased much.    MS History:   In December 2015 he had a several month history of dizziness, ataxic gait and hand numbness.    MRI early December 2015 showed multiple white matter foci including several in the posterior fossa (left middle cerebellar peduncle, pons, medulla) and periventricular foci in both hemispheres. He also had a spine focus at C2.  There were no acute lesions (no enhancement, no DWI brightness).  Blood work showed normal or negative vasculitis labs, ACE, Lyme, HIV, RPR, pan ANCA, and anti-NMO antibody.   Visual evoked potentials were abnormal showing prolongation of the P100 to about 130 ms on either side.  Lumbar puncture on 03/05/2014 showed abnormal CSF with > 5 oligoclonal bands and elevated IgG index of 0.78.    MRI of the brain 12/15/2014 was unchanged.  He started Gilenya after diagnosis.  He did well with it.  However, he switched ocrelizumab  in late 2017 due to a desire to start a family and the possible risk of increased birth defects, even when the male is taking Gilenya.     IMAGING MRI of the brain 11/20/2022 showed  Multiple T2/FLAIR hyperintense foci in the cerebral hemispheres, spinal cord, brainstem, right cerebellar hemisphere and bilateral thalamus in a pattern consistent with chronic demyelinating plaque associated with multiple sclerosis. None of the foci enhance or appear to be acute. Compared to the MRI from 04/19/2021, there were no new lesions.   MRI 04/19/2021 showed T2/FLAIR hyperintense foci in the spinal cord, brainstem, left middle cerebellar peduncle, bilateral thalamus and in the hemispheres in a pattern consistent with chronic demyelinating plaque associated with multiple sclerosis. None of these foci appear to be acute. They do not enhance. Compared to the MRI from 12/09/2019, there are no new lesions.   MRI of the cervical spine 12/04/2017 shows multiple T2  hyperintense foci within the spinal cord located just below the cervicomedullary junction, adjacent to C2 to the left, posteriorly adjacent to C3, posteriorly to the right adjacent to C3-C4, posteriorly adjacent to C4-C5, laterally to the right adjacent to C6, posteriorly adjacent to C7 and lateral to the left adjacent to T1.   None of these appear to be acute and none of them enhance after contrast..  MRI of the brain 12/04/2017 shows foci within the spinal cord adjacent to C2 and C3. There are several small T2/FLAIR hyperintense foci in the cerebellar hemispheres.  Foci are noted with in the medulla, left middle cerebellar peduncle, pons, and bilaterally in the thalamus.  In the hemispheres, there are multiple T2/FLAIR hyperintense foci in the periventricular, juxtacortical and deep white matter.  Many of the periventricular foci are radially oriented to the ventricles. Some foci are hypointense on T1-weighted images. There were no enhancing lesions. When compared to the MRI dated 07/11/2016, there are no new lesions.   MRI of the brain 12/09/2019 showed T2/flair hyperintense foci in the hemispheres, brainstem, thalamus, left middle cerebellar peduncle and spinal cord in a pattern and configuration consistent with chronic demyelinating plaque associated with multiple sclerosis.  None of the foci enhances or appears to be acute.  Compared to the MRI dated 12/04/2017, there do not appear to be any new lesions.   .   Chronic sinusitis involving the left maxillary, ethmoid and frontal sinuses.  ROS: Out of a complete 14 system review of symptoms, the patient complains only of the following symptoms, and all other reviewed systems are negative.  He notes difficulty with urinary hesitancy.  He has fatigue   ALLERGIES: No Known Allergies  HOME MEDICATIONS:  Current Outpatient Medications:    Cholecalciferol (VITAMIN D  PO), Take 5,000 Units by mouth daily., Disp: , Rfl:    dalfampridine  10 MG TB12, One po  q12 hours, Disp: 60 tablet, Rfl: 11   multivitamin (PROSIGHT) TABS tablet, Take 1 tablet by mouth daily., Disp: , Rfl:    OCREVUS  300 MG/10ML injection, INFUSE 600 MG INTRAVENOUSLY IN 500 ML OF NORMAL SALINE EVERY 6 MONTHS. INFUSE OVER 3.5 HOURS OR LONGER, Disp: 20 mL, Rfl: 1 No current facility-administered medications for this visit.  Facility-Administered Medications Ordered in Other Visits:    gadopentetate dimeglumine  (MAGNEVIST ) injection 20 mL, 20 mL, Intravenous, Once PRN, Cassie Shedlock, Charlie LABOR, MD  PAST MEDICAL HISTORY: Past Medical History:  Diagnosis Date   Carpal tunnel syndrome    Hypertension    Movement disorder    Multiple sclerosis (HCC)    Vision abnormalities     PAST SURGICAL HISTORY: Past Surgical History:  Procedure Laterality Date   MOUTH SURGERY  10 years ago    FAMILY HISTORY: Family History  Problem Relation Age of Onset  High blood pressure Mother    Cancer Maternal Grandmother    High blood pressure Father     SOCIAL HISTORY:  Social History   Socioeconomic History   Marital status: Married    Spouse name: Not on file   Number of children: 2   Years of education: Assoc   Highest education level: Not on file  Occupational History    Employer: OTHER    Comment: [employer]  Tobacco Use   Smoking status: Never   Smokeless tobacco: Never  Substance and Sexual Activity   Alcohol use: Yes    Comment: socially   Drug use: No   Sexual activity: Not on file  Other Topics Concern   Not on file  Social History Narrative   Patient lives at home with his family.   Caffeine use: 1 drink per day (soda), celcius energy drinks    Patient has a college education    Patient has 2 children    Patient is right handed    Social Drivers of Corporate Investment Banker Strain: Not on file  Food Insecurity: Low Risk  (12/17/2022)   Received from [facility]   Hunger Vital Sign    Worried About Running Out of Food in the Last Year: Never true     Ran Out of Food in the Last Year: Never true  Transportation Needs: No Transportation Needs (12/17/2022)   Received from [facility]    In the past 12 months, has lack of reliable transportation kept you from medical appointments, meetings, work or from getting things needed for daily living? : No  Physical Activity: Not on file  Stress: Not on file  Social Connections: Unknown (06/18/2022)   Received from [facility]   Social Network    Social Network: Not on file  Intimate Partner Violence: Not At Risk (06/18/2022)   Received from [facility]   HITS    Over the last 12 months how often did your partner physically hurt you?: Never    Over the last 12 months how often did your partner insult you or talk down to you?: Never    Over the last 12 months how often did your partner threaten you with physical harm?: Never    Over the last 12 months how often did your partner scream or curse at you?: Never     PHYSICAL EXAM  Vitals:   02/06/23 1558  BP: (!) 145/90  Pulse: 85  Weight: 240 lb (108.9 kg)  Height: 5' 9 (1.753 m)     Body mass index is 35.44 kg/m.   General: The patient is well-developed and well-nourished and in no acute distress    Neurologic Exam  Mental status: The patient is alert and oriented x 3 at the time of the examination. The patient has apparent normal recent and remote memory, with an apparently normal attention span and concentration ability.   Speech is normal.  Cranial nerves: Extraocular movements are full.  Saccadic pursuit was mildly abnormal looking to the left.  There were a few beats of nystagmus looking to the left..  There is a slight left APD.  Color vision is symmetric.  Facial strength and sensation is normal.  Trapezius strength is normal..  No dysarthria is noted. Hearing appears normal and symmetric.  Motor:  Muscle bulk is normal.   Tone is slightly increased in left leg. . Strength is  5 / 5 in all 4  extremities on  formal testing.functional testing he has very slight weakness in the left leg upon rising from a squat.   Coordination: Cerebellar testing shows good finger-nose-finger and heel-to-shin bilaterally  Sensory: He reports symmetric sensation to touch and temperature in the arms and legs.  Gait and station: Station is normal.  Gait shows slight left leg spasticity and tandem is wide.  .. Romberg is negative  DTRs:   Deep tendon reflexes are normal in the arms, 3+ at the knees and ankles.  25 foot timed walk of 9.2 sec (average of 2)     ASSESSMENT AND PLAN  High risk medication use - Plan: Comprehensive metabolic panel, CBC with Differential/Platelet, IgG, IgA, IgM  Relapsing remitting multiple sclerosis (HCC) - Plan: Comprehensive metabolic panel, CBC with Differential/Platelet, IgG, IgA, IgM, Ambulatory referral to Urology  Left leg weakness  Urinary hesitancy - Plan: Ambulatory referral to Urology  Diplopia  Gait disturbance   1.  Continue Ocrevus .  Check IgG/IgM and CBC/D and CMP 2.  Stay active and exercise as tolerated.     3.  He has urinary hesitancy that did not respond to medications.  Refer to urology.   4.  Continue Vit D OTC supp's 5.  Possible RLS/PLMS - consider a dopamine agonist if worsens 6.   Dalfampridine  10 mg po q12 for gait   check crt/BUN.  Do not mix with tramadol. Return in 6 months or sooner if there are new or worsening neurologic symptoms.  EDSS derived: Motor 3; Visual, brainstem, cerebellar,sensory 2, Bladder and ambulation 1   EDSS SCORE 3.5  Daisha Filosa A. Vear, MD, PhD 02/06/2023, 4:38 PM Certified in Neurology, Clinical Neurophysiology, Sleep Medicine, Pain Medicine and Neuroimaging  [facility] [address] [place], [place] [address] [phone]

## 2023-02-06 ENCOUNTER — Ambulatory Visit: Payer: Commercial Managed Care - PPO | Admitting: Neurology

## 2023-02-06 ENCOUNTER — Encounter: Payer: Self-pay | Admitting: Neurology

## 2023-02-06 VITALS — BP 145/90 | HR 85 | Ht 69.0 in | Wt 240.0 lb

## 2023-02-06 DIAGNOSIS — R29898 Other symptoms and signs involving the musculoskeletal system: Secondary | ICD-10-CM

## 2023-02-06 DIAGNOSIS — R3911 Hesitancy of micturition: Secondary | ICD-10-CM

## 2023-02-06 DIAGNOSIS — G35 Multiple sclerosis: Secondary | ICD-10-CM | POA: Diagnosis not present

## 2023-02-06 DIAGNOSIS — H532 Diplopia: Secondary | ICD-10-CM

## 2023-02-06 DIAGNOSIS — Z79899 Other long term (current) drug therapy: Secondary | ICD-10-CM

## 2023-02-06 DIAGNOSIS — R269 Unspecified abnormalities of gait and mobility: Secondary | ICD-10-CM

## 2023-02-06 MED ORDER — DALFAMPRIDINE ER 10 MG PO TB12: ORAL_TABLET | ORAL | 11 refills | Status: DC

## 2023-02-07 ENCOUNTER — Encounter: Payer: Self-pay | Admitting: Neurology

## 2023-02-07 ENCOUNTER — Telehealth: Payer: Self-pay | Admitting: Neurology

## 2023-02-07 LAB — CBC WITH DIFFERENTIAL/PLATELET
Basophils Absolute: 0 10*3/uL (ref 0.0–0.2)
Basos: 1 %
EOS (ABSOLUTE): 0.1 10*3/uL (ref 0.0–0.4)
Eos: 1 %
Hematocrit: 43.7 % (ref 37.5–51.0)
Hemoglobin: 14.4 g/dL (ref 13.0–17.7)
Immature Grans (Abs): 0 10*3/uL (ref 0.0–0.1)
Immature Granulocytes: 0 %
Lymphocytes Absolute: 1.6 10*3/uL (ref 0.7–3.1)
Lymphs: 29 %
MCH: 26.3 pg — ABNORMAL LOW (ref 26.6–33.0)
MCHC: 33 g/dL (ref 31.5–35.7)
MCV: 80 fL (ref 79–97)
Monocytes Absolute: 0.5 10*3/uL (ref 0.1–0.9)
Monocytes: 8 %
Neutrophils Absolute: 3.4 10*3/uL (ref 1.4–7.0)
Neutrophils: 61 %
Platelets: 291 10*3/uL (ref 150–450)
RBC: 5.47 x10E6/uL (ref 4.14–5.80)
RDW: 13.1 % (ref 11.6–15.4)
WBC: 5.6 10*3/uL (ref 3.4–10.8)

## 2023-02-07 LAB — COMPREHENSIVE METABOLIC PANEL
ALT: 25 [IU]/L (ref 0–44)
AST: 28 [IU]/L (ref 0–40)
Albumin: 4.6 g/dL (ref 4.1–5.1)
Alkaline Phosphatase: 73 [IU]/L (ref 44–121)
BUN/Creatinine Ratio: 11 (ref 9–20)
BUN: 13 mg/dL (ref 6–20)
Bilirubin Total: 0.6 mg/dL (ref 0.0–1.2)
CO2: 20 mmol/L (ref 20–29)
Calcium: 9.8 mg/dL (ref 8.7–10.2)
Chloride: 102 mmol/L (ref 96–106)
Creatinine, Ser: 1.16 mg/dL (ref 0.76–1.27)
Globulin, Total: 2.3 g/dL (ref 1.5–4.5)
Glucose: 92 mg/dL (ref 70–99)
Potassium: 4 mmol/L (ref 3.5–5.2)
Sodium: 142 mmol/L (ref 134–144)
Total Protein: 6.9 g/dL (ref 6.0–8.5)
eGFR: 84 mL/min/{1.73_m2} (ref >59)

## 2023-02-07 LAB — IGG, IGA, IGM
IgA/Immunoglobulin A, Serum: 114 mg/dL (ref 90–386)
IgG (Immunoglobin G), Serum: 940 mg/dL (ref 603–1613)
IgM (Immunoglobulin M), Srm: 34 mg/dL (ref 20–172)

## 2023-02-07 NOTE — Telephone Encounter (Signed)
 Referral for urology fax to [facility]. [phone], Fax: [phone]

## 2023-02-11 ENCOUNTER — Telehealth: Payer: Self-pay

## 2023-02-11 ENCOUNTER — Telehealth: Payer: Self-pay | Admitting: Neurology

## 2023-02-11 ENCOUNTER — Other Ambulatory Visit (HOSPITAL_COMMUNITY): Payer: Self-pay

## 2023-02-11 NOTE — Telephone Encounter (Signed)
 Thank you for this information!  I submitted this information to the plan- so we will see what happens!

## 2023-02-11 NOTE — Telephone Encounter (Signed)
 See other phone note started by pharmacy.

## 2023-02-11 NOTE — Telephone Encounter (Signed)
 Prior Authorization form/request asks a question that requires your assistance. Please see the question below and advise accordingly. The PA will not be submitted until the necessary information is received.   What medication(s) has the patient tried and failed?

## 2023-02-11 NOTE — Telephone Encounter (Signed)
 Pt has been told that a PA is needed for his dalfampridine  10 MG TB12

## 2023-02-11 NOTE — Telephone Encounter (Signed)
 Ok great, thank you.

## 2023-02-11 NOTE — Telephone Encounter (Signed)
 Dalfampridine  is the only FDA approved medication to help improve walking in MS patients. There are no other meds to try/fail for this. It is not a disease modifying therapy for MS.

## 2023-02-11 NOTE — Telephone Encounter (Signed)
*  [facility]  Pharmacy Patient Advocate Encounter   Received notification from Pt Calls Messages that prior authorization for Dalfampridine  ER 10MG  er tablets  is required/requested.   Insurance verification completed.   The patient is insured through [facility] .   Per test claim: PA required; PA started via CoverMyMeds. KEY [id number] . Please see clinical question(s) below that I am not finding the answer to in her chart and advise.    What medication(s) has the patient tried and failed:

## 2023-02-18 NOTE — Telephone Encounter (Signed)
Dr Epimenio Foot- are you able to provide a disability score?

## 2023-02-18 NOTE — Telephone Encounter (Signed)
Pharmacy Patient Advocate Encounter  Received notification from [facility] that Prior Authorization for Dalfampridine ER 10mg  has been DENIED.  Full denial letter will be uploaded to the media tab. See denial reason below.   PA #/Case ID/Reference #: The denial was based on our criteria for Dalfampridin Tab 10mg  Er.  Per your health plan's criteria, this drug is covered if you meet the following:. One of the following: (A) You have a disability score of seven or less (on the expanded disability status scale). (B) You are not restricted to using a wheelchair (if expanded disability status scale is not measured). The information provided does not show that you meet the criteria listed above.

## 2023-02-19 ENCOUNTER — Telehealth: Payer: Self-pay | Admitting: Pharmacist

## 2023-02-19 NOTE — Telephone Encounter (Signed)
An E-Appeal has been submitted for dalfampridine ER 10mg  tablets. Will advise when response is received, please be advised that most companies may take 30 days to make a decision.  Appeal letter and supporting documentation were uploaded in [facility] on 02/19/2023 @2 :25pm.  Thank you, Dellie Burns, PharmD Clinical Pharmacist    Direct Dial: [phone]

## 2023-02-20 ENCOUNTER — Other Ambulatory Visit (HOSPITAL_COMMUNITY): Payer: Self-pay

## 2023-02-20 NOTE — Telephone Encounter (Signed)
Pharmacy Patient Advocate Encounter  Received notification from [facility] that Prior Authorization for Dalfampridine er 10mg  has been APPROVED from 02/19/2023 to 08/19/2023   MUST USE SPECIALTY: CALL [id number] SPECIALTY PHARMACY REQUIRED

## 2023-02-20 NOTE — Telephone Encounter (Signed)
PA decision has been OVERTURNED and is now covered through 08/19/2023-approval letter has been attached in patients media.

## 2023-02-28 ENCOUNTER — Ambulatory Visit: Payer: Commercial Managed Care - PPO | Admitting: Neurology

## 2023-05-14 ENCOUNTER — Other Ambulatory Visit (HOSPITAL_COMMUNITY): Payer: Self-pay

## 2023-05-14 ENCOUNTER — Other Ambulatory Visit: Payer: Self-pay | Admitting: Neurology

## 2023-05-14 ENCOUNTER — Telehealth: Payer: Self-pay | Admitting: Neurology

## 2023-05-14 NOTE — Telephone Encounter (Signed)
     I ran a test claim and got a rejection only due to the fact that our pharmacy is excluded and PT must use a specific pharmacy.

## 2023-05-14 NOTE — Telephone Encounter (Signed)
 Called pt at [phone] and LVM.  Medication needs to be filled at specialty mail order pharmacy. Needing to know what pharmacy he wants us  to send it to.

## 2023-05-14 NOTE — Telephone Encounter (Signed)
 I called patient and informed him as of 02/19/23, we received approval. Pt states he has not been by the pharmacy and will check  with [facility] and get back with us  if needed.    \

## 2023-05-14 NOTE — Telephone Encounter (Signed)
 Pt reports her has been told by local pharmacy that a PA is needed on his dalfampridine  10 MG TB12

## 2023-05-15 MED ORDER — DALFAMPRIDINE ER 10 MG PO TB12: ORAL_TABLET | ORAL | 11 refills | Status: DC

## 2023-05-15 NOTE — Telephone Encounter (Signed)
 Pt is returning call , transferred pt

## 2023-05-15 NOTE — Telephone Encounter (Signed)
 Spoke to [facility]

## 2023-08-22 ENCOUNTER — Ambulatory Visit: Payer: Commercial Managed Care - PPO | Admitting: Neurology

## 2023-08-22 ENCOUNTER — Encounter: Payer: Self-pay | Admitting: Neurology

## 2023-08-22 VITALS — BP 138/89 | HR 68 | Ht 69.0 in | Wt 238.8 lb

## 2023-08-22 DIAGNOSIS — R269 Unspecified abnormalities of gait and mobility: Secondary | ICD-10-CM

## 2023-08-22 DIAGNOSIS — R29898 Other symptoms and signs involving the musculoskeletal system: Secondary | ICD-10-CM | POA: Diagnosis not present

## 2023-08-22 DIAGNOSIS — R3911 Hesitancy of micturition: Secondary | ICD-10-CM

## 2023-08-22 DIAGNOSIS — G35 Multiple sclerosis: Secondary | ICD-10-CM | POA: Diagnosis not present

## 2023-08-22 DIAGNOSIS — H532 Diplopia: Secondary | ICD-10-CM

## 2023-08-22 DIAGNOSIS — M21372 Foot drop, left foot: Secondary | ICD-10-CM

## 2023-08-22 DIAGNOSIS — Z79899 Other long term (current) drug therapy: Secondary | ICD-10-CM

## 2023-08-22 MED ORDER — VITAMIN D (ERGOCALCIFEROL) 1.25 MG (50000 UNIT) PO CAPS: 50000.0000 [IU] | ORAL_CAPSULE | ORAL | 3 refills | Status: AC

## 2023-08-22 NOTE — Progress Notes (Unsigned)
 [facility]  PATIENT: Lucas Berry DOB: [birth year]     HISTORICAL  CHIEF COMPLAINT:  Chief Complaint  Patient presents with   Follow-up    Pt in 11 alone Pt here MS f/u  Pt states some fatigue Pt states schedule changed a little at work Pt states some days he has early mornings and late evenings     HISTORY OF PRESENT ILLNESS:  Lucas Berry is a 38 y.o. 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     Update 08/22/2023: He is on Ocrevus .  He tolerates it well.  Next infusion 11/2023.    He has no exacerbations .  However, he notes that the left leg seems a little bit worse, especially in the afternoons.  His gait has slowed.  He sometimes has difficulty raising the left leg when he put his pants on or when he enters the car.     MRI of the brain 11/20/2022 showing foci in the hemispheres, spinal cord, brainstem and cerebellum and thalamus consistent with MS.  There were no acute findings.  No change compared to the 04/19/2021 MRI.  The left leg is mildly weak with some effect on his gait. He tires out as the day goes on.  Some stumbles but no falls.  Dalfampridine  only helps foot dropslightly.   He does worse in heat.    There is mild left leg spasticity.  He has mild hand numbness since initial exacerbation  Vision is sometimes more blurry later in the day.     This is a mild diplopia, sometimes with visible dysconjugate eyes     Both of these symptoms had been associated with initial exacerbatios in 2015.    He has some urinary hesitancy but did not get much of any benefit from Rapaflo , Alfuzoson or Flomax .  He saw urology but a medication had not helped.   He will be seeing hr back and may need urodynamic testing.   He falls asleep easily but if the dog needs to go out (1-3 am) he has trouble falling back asleep.    Weight is stable.   He snores but has never woken up gasping.. Wife has not told him he has OSA signs.   He is sometimes sleepy  during the day.   He had wrist surgery 02/2023 due to an injury.   He used to exercises regularly but doing less more recently.     He yawns but does not doze off.     MS History:   In December 2015 he had a several month history of dizziness, ataxic gait and hand numbness.    MRI early December 2015 showed multiple white matter foci including several in the posterior fossa (left middle cerebellar peduncle, pons, medulla) and periventricular foci in both hemispheres. He also had a spine focus at C2.  There were no acute lesions (no enhancement, no DWI brightness).  Blood work showed normal or negative vasculitis labs, ACE, Lyme, HIV, RPR, pan ANCA, and anti-NMO antibody.   Visual evoked potentials were abnormal showing prolongation of the P100 to about 130 ms on either side.  Lumbar puncture on 03/05/2014 showed abnormal CSF with > 5 oligoclonal bands and elevated IgG index of 0.78.    MRI of the brain 12/15/2014 was unchanged.  He started Gilenya after diagnosis.  He did well with it.  However, he switched ocrelizumab  in late 2017 due to a desire to start a family and the possible  risk of increased birth defects, even when the male is taking Gilenya.     IMAGING MRI of the brain 11/20/2022 showed Multiple T2/FLAIR hyperintense foci in the cerebral hemispheres, spinal cord, brainstem, right cerebellar hemisphere and bilateral thalamus in a pattern consistent with chronic demyelinating plaque associated with multiple sclerosis. None of the foci enhance or appear to be acute. Compared to the MRI from 04/19/2021, there were no new lesions.   MRI 04/19/2021 showed T2/FLAIR hyperintense foci in the spinal cord, brainstem, left middle cerebellar peduncle, bilateral thalamus and in the hemispheres in a pattern consistent with chronic demyelinating plaque associated with multiple sclerosis. None of these foci appear to be acute. They do not enhance. Compared to the MRI from 12/09/2019, there are no new lesions.    MRI of the cervical spine 12/04/2017 shows multiple T2 hyperintense foci within the spinal cord located just below the cervicomedullary junction, adjacent to C2 to the left, posteriorly adjacent to C3, posteriorly to the right adjacent to C3-C4, posteriorly adjacent to C4-C5, laterally to the right adjacent to C6, posteriorly adjacent to C7 and lateral to the left adjacent to T1.   None of these appear to be acute and none of them enhance after contrast..  MRI of the brain 12/04/2017 shows foci within the spinal cord adjacent to C2 and C3. There are several small T2/FLAIR hyperintense foci in the cerebellar hemispheres.  Foci are noted with in the medulla, left middle cerebellar peduncle, pons, and bilaterally in the thalamus.  In the hemispheres, there are multiple T2/FLAIR hyperintense foci in the periventricular, juxtacortical and deep white matter.  Many of the periventricular foci are radially oriented to the ventricles. Some foci are hypointense on T1-weighted images. There were no enhancing lesions. When compared to the MRI dated 07/11/2016, there are no new lesions.   MRI of the brain 12/09/2019 showed T2/flair hyperintense foci in the hemispheres, brainstem, thalamus, left middle cerebellar peduncle and spinal cord in a pattern and configuration consistent with chronic demyelinating plaque associated with multiple sclerosis.  None of the foci enhances or appears to be acute.  Compared to the MRI dated 12/04/2017, there do not appear to be any new lesions.   .   Chronic sinusitis involving the left maxillary, ethmoid and frontal sinuses.  ROS: Out of a complete 14 system review of symptoms, the patient complains only of the following symptoms, and all other reviewed systems are negative.  He notes difficulty with urinary hesitancy.  He has fatigue   ALLERGIES: No Known Allergies  HOME MEDICATIONS:  Current Outpatient Medications:    Cholecalciferol (VITAMIN D  PO), Take 5,000 Units by mouth  daily., Disp: , Rfl:    dalfampridine  10 MG TB12, One po q12 hours, Disp: 60 tablet, Rfl: 11   multivitamin (PROSIGHT) TABS tablet, Take 1 tablet by mouth daily., Disp: , Rfl:    OCREVUS  300 MG/10ML injection, INFUSE 600 MG INTRAVENOUSLY IN 500 ML OF NORMAL SALINE EVERY 6 MONTHS. INFUSE OVER 3.5 HOURS OR LONGER, Disp: 20 mL, Rfl: 1 No current facility-administered medications for this visit.  Facility-Administered Medications Ordered in Other Visits:    gadopentetate dimeglumine  (MAGNEVIST ) injection 20 mL, 20 mL, Intravenous, Once PRN, Elaya Droege, Charlie LABOR, MD  PAST MEDICAL HISTORY: Past Medical History:  Diagnosis Date   Carpal tunnel syndrome    Hypertension    Movement disorder    Multiple sclerosis (HCC)    Vision abnormalities     PAST SURGICAL HISTORY: Past Surgical History:  Procedure Laterality Date  left wrist surgery      02/2023   MOUTH SURGERY  10 years ago    FAMILY HISTORY: Family History  Problem Relation Age of Onset   High blood pressure Mother    High blood pressure Father    Cancer Maternal Grandmother    Multiple sclerosis Neg Hx     SOCIAL HISTORY:  Social History   Socioeconomic History   Marital status: Married    Spouse name: Not on file   Number of children: 2   Years of education: Assoc   Highest education level: Not on file  Occupational History    Employer: OTHER    Comment: [employer]  Tobacco Use   Smoking status: Never   Smokeless tobacco: Never  Vaping Use   Vaping status: Never Used  Substance and Sexual Activity   Alcohol use: Yes    Comment: socially   Drug use: No   Sexual activity: Not on file  Other Topics Concern   Not on file  Social History Narrative   Patient lives at home with his family.   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 : Never true  Transportation Needs: No Transportation Needs (12/17/2022)   Received from [facility]    In the past 12 months, has lack of reliable transportation kept you from medical appointments, meetings, work or from getting things needed for daily living? : No  Physical Activity: Not on file  Stress: Not on file  Social Connections: Unknown (06/18/2022)   Received from [facility]   Social Network    Social Network: Not on file  Intimate Partner Violence: Not At Risk (06/18/2022)   Received from [facility]   HITS    Over the last 12 months how often did your partner physically hurt you?: Never    Over the last 12 months how often did your partner insult you or talk down to you?: Never    Over the last 12 months how often did your partner threaten you with physical harm?: Never    Over the last 12 months how often did your partner scream or curse at you?: Never     PHYSICAL EXAM  Vitals:   08/22/23 1516  BP: 138/89  Pulse: 68  Weight: 238 lb 12.8 oz (108.3 kg)  Height: 5' 9 (1.753 m)     Body mass index is 35.26 kg/m.   General: The patient is well-developed and well-nourished and in no acute distress    Neurologic Exam  Mental status: The patient is alert and oriented x 3 at the time of the examination. The patient has apparent normal recent and remote memory, with an apparently normal attention span and concentration ability.   Speech is normal.  Cranial nerves: Extraocular movements are full.  Saccadic pursuit was mildly abnormal looking to  the left.  There were a few beats of nystagmus looking to the left..   There is a slight left APD.  Color vision is symmetric.  Facial strength and sensation is normal.  Trapezius strength is normal..  No dysarthria is noted. Hearing appears normal and symmetric.  Motor:  Muscle bulk is normal.   Tone is slightly increased in left leg. . Strength is  5 / 5 in all 4 extremities on formal testing.functional testing he has very slight weakness in the left leg upon rising from a squat.   Coordination: Cerebellar testing shows good finger-nose-finger and heel-to-shin bilaterally  Sensory: He reports symmetric sensation to touch and temperature in the arms and legs.  Gait and station: Station is normal.  Gait shows slight left leg spasticity and tandem is wide.  .. Romberg is negative  DTRs:   Deep tendon reflexes are normal in the arms, 3+ at the knees and ankles.  25 foot timed walk of 9.2 sec (average of 2)     ASSESSMENT AND PLAN  No diagnosis found.   1.  Continue Ocrevus .  Check IgG/IgM and CBC/D and CMP 2.  Stay active and exercise as tolerated.    Refer for a left AFO 3.  He has urinary hesitancy that did not respond to medications.  Refer to urology.   4.  Continue Vit D supp's 5.  Left AFO brace.  Continue dalfampridine  10 mg po q12 for gait    Do not mix with tramadol. Return in 6 months or sooner if there are new or worsening neurologic symptoms.    Dariane Natzke A. Vear, MD, PhD 08/22/2023, 3:30 PM Certified in Neurology, Clinical Neurophysiology, Sleep Medicine, Pain Medicine and Neuroimaging  [facility] [address], [address] [place], [place] [address] [phone]

## 2023-08-22 NOTE — Progress Notes (Unsigned)
 Rx faxed to [facility], received fax confirmation.

## 2023-08-23 ENCOUNTER — Ambulatory Visit: Payer: Self-pay | Admitting: Neurology

## 2023-08-23 LAB — CBC WITH DIFFERENTIAL/PLATELET
Basophils Absolute: 0 x10E3/uL (ref 0.0–0.2)
Basos: 0 %
EOS (ABSOLUTE): 0 x10E3/uL (ref 0.0–0.4)
Eos: 0 %
Hematocrit: 45.5 % (ref 37.5–51.0)
Hemoglobin: 14.6 g/dL (ref 13.0–17.7)
Immature Grans (Abs): 0 x10E3/uL (ref 0.0–0.1)
Immature Granulocytes: 0 %
Lymphocytes Absolute: 1.7 x10E3/uL (ref 0.7–3.1)
Lymphs: 25 %
MCH: 26.7 pg (ref 26.6–33.0)
MCHC: 32.1 g/dL (ref 31.5–35.7)
MCV: 83 fL (ref 79–97)
Monocytes Absolute: 0.5 x10E3/uL (ref 0.1–0.9)
Monocytes: 7 %
Neutrophils Absolute: 4.6 x10E3/uL (ref 1.4–7.0)
Neutrophils: 68 %
Platelets: 278 x10E3/uL (ref 150–450)
RBC: 5.47 x10E6/uL (ref 4.14–5.80)
RDW: 13.1 % (ref 11.6–15.4)
WBC: 6.8 x10E3/uL (ref 3.4–10.8)

## 2023-08-23 LAB — IGG, IGA, IGM
IgA/Immunoglobulin A, Serum: 113 mg/dL (ref 90–386)
IgG (Immunoglobin G), Serum: 954 mg/dL (ref 603–1613)
IgM (Immunoglobulin M), Srm: 38 mg/dL (ref 20–172)

## 2023-08-27 ENCOUNTER — Telehealth: Payer: Self-pay

## 2023-08-27 ENCOUNTER — Other Ambulatory Visit (HOSPITAL_COMMUNITY): Payer: Self-pay

## 2023-08-27 NOTE — Telephone Encounter (Signed)
 Pharmacy Patient Advocate Encounter   Received notification from Fax that prior authorization for DALFAMPRIDINE  is required/requested.   Insurance verification completed.   The patient is insured through [facility] .   Per test claim: PA required; PA submitted to above mentioned insurance via Latent Key/confirmation #/EOC [id number] Status is pending

## 2023-08-27 NOTE — Telephone Encounter (Signed)
 Faxed AFO Order to the [facility] [phone] on 08/27/2023.

## 2023-08-28 NOTE — Telephone Encounter (Signed)
 Pharmacy Patient Advocate Encounter  Received notification from [employer] that Prior Authorization for Daldampridine has been APPROVED from 08/27/2023 to 12/31/2037   PA #/Case ID/Reference #: [id number]

## 2023-09-23 ENCOUNTER — Telehealth: Payer: Self-pay | Admitting: *Deleted

## 2023-09-23 NOTE — Telephone Encounter (Signed)
 Faxed completed/signed form below back to [facility].Received fax confirmation.

## 2023-09-30 ENCOUNTER — Encounter: Payer: Self-pay | Admitting: Neurology

## 2023-12-05 ENCOUNTER — Telehealth: Payer: Self-pay | Admitting: Neurology

## 2023-12-05 NOTE — Telephone Encounter (Signed)
 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 Nurse gave me to correct fax number: [phone]

## 2023-12-09 NOTE — Telephone Encounter (Signed)
 Patient called to check on status of DOT paperwork.

## 2023-12-09 NOTE — Telephone Encounter (Signed)
 Form placed in pod 1 for completion. Looks like last one was completed under Amy. Patient last saw Dr Vear.

## 2023-12-10 NOTE — Telephone Encounter (Signed)
 Pt called to Follow up about form that wer suppose to [facility] faxed for DOT  . Informed  Pt that form are pending signature by MD.Pt stated can he get a call when  Paperwork is signed .

## 2023-12-11 NOTE — Telephone Encounter (Signed)
 Completed forms placed for pick up by medical records

## 2023-12-16 ENCOUNTER — Telehealth: Payer: Self-pay | Admitting: *Deleted

## 2023-12-16 NOTE — Telephone Encounter (Signed)
 Form faxed on 12/16/2023 to [phone]

## 2023-12-17 NOTE — Telephone Encounter (Signed)
 Pt called back  Inform Pt forms was faxed yesterday .

## 2024-02-06 ENCOUNTER — Other Ambulatory Visit: Payer: Self-pay

## 2024-02-06 MED ORDER — DALFAMPRIDINE ER 10 MG PO TB12: ORAL_TABLET | ORAL | 11 refills | Status: AC

## 2024-03-31 ENCOUNTER — Ambulatory Visit: Admitting: Neurology
